# Patient Record
Sex: Female | Born: 1945 | Race: Black or African American | Hispanic: No | Smoking: Former smoker
Health system: Southern US, Community
[De-identification: ages and names within clinical notes are randomized; demographics above are authoritative.]

## PROBLEM LIST (undated history)

## (undated) DIAGNOSIS — H409 Unspecified glaucoma: Secondary | ICD-10-CM

## (undated) DIAGNOSIS — E7800 Pure hypercholesterolemia, unspecified: Secondary | ICD-10-CM

## (undated) DIAGNOSIS — I10 Essential (primary) hypertension: Secondary | ICD-10-CM

## (undated) HISTORY — PX: CATARACT EXTRACTION: SUR2

## (undated) HISTORY — PX: DENTAL SURGERY: SHX609

## (undated) HISTORY — PX: EYE SURGERY: SHX253

## (undated) HISTORY — PX: CERVICAL DISCECTOMY: SHX98

## (undated) HISTORY — PX: ECTOPIC PREGNANCY SURGERY: SHX613

## (undated) HISTORY — PX: TONSILLECTOMY: SUR1361

---

## 1972-01-30 HISTORY — PX: ECTOPIC PREGNANCY SURGERY: SHX613

## 1997-08-05 ENCOUNTER — Ambulatory Visit (HOSPITAL_COMMUNITY): Admission: RE | Admit: 1997-08-05 | Discharge: 1997-08-05 | Payer: Self-pay | Admitting: Internal Medicine

## 1997-09-10 ENCOUNTER — Ambulatory Visit (HOSPITAL_COMMUNITY): Admission: RE | Admit: 1997-09-10 | Discharge: 1997-09-10 | Payer: Self-pay | Admitting: Internal Medicine

## 1997-09-17 ENCOUNTER — Ambulatory Visit (HOSPITAL_COMMUNITY): Admission: RE | Admit: 1997-09-17 | Discharge: 1997-09-17 | Payer: Self-pay | Admitting: Gastroenterology

## 1997-11-25 ENCOUNTER — Encounter: Payer: Self-pay | Admitting: General Surgery

## 1997-11-29 ENCOUNTER — Ambulatory Visit (HOSPITAL_COMMUNITY): Admission: RE | Admit: 1997-11-29 | Discharge: 1997-11-30 | Payer: Self-pay | Admitting: General Surgery

## 1997-12-03 ENCOUNTER — Encounter: Admission: RE | Admit: 1997-12-03 | Discharge: 1998-03-03 | Payer: Self-pay | Admitting: Orthopedic Surgery

## 1998-08-12 ENCOUNTER — Ambulatory Visit (HOSPITAL_COMMUNITY): Admission: RE | Admit: 1998-08-12 | Discharge: 1998-08-12 | Payer: Self-pay | Admitting: Internal Medicine

## 1998-08-12 ENCOUNTER — Encounter: Payer: Self-pay | Admitting: Internal Medicine

## 1999-08-14 ENCOUNTER — Encounter: Payer: Self-pay | Admitting: Internal Medicine

## 1999-08-14 ENCOUNTER — Ambulatory Visit (HOSPITAL_COMMUNITY): Admission: RE | Admit: 1999-08-14 | Discharge: 1999-08-14 | Payer: Self-pay | Admitting: Internal Medicine

## 1999-10-24 ENCOUNTER — Other Ambulatory Visit: Admission: RE | Admit: 1999-10-24 | Discharge: 1999-10-24 | Payer: Self-pay | Admitting: Internal Medicine

## 2000-08-14 ENCOUNTER — Encounter: Payer: Self-pay | Admitting: Internal Medicine

## 2000-08-14 ENCOUNTER — Ambulatory Visit (HOSPITAL_COMMUNITY): Admission: RE | Admit: 2000-08-14 | Discharge: 2000-08-14 | Payer: Self-pay | Admitting: Internal Medicine

## 2000-10-24 ENCOUNTER — Other Ambulatory Visit: Admission: RE | Admit: 2000-10-24 | Discharge: 2000-10-24 | Payer: Self-pay | Admitting: Internal Medicine

## 2001-03-18 ENCOUNTER — Encounter: Admission: RE | Admit: 2001-03-18 | Discharge: 2001-05-05 | Payer: Self-pay | Admitting: Orthopedic Surgery

## 2001-06-03 ENCOUNTER — Encounter: Admission: RE | Admit: 2001-06-03 | Discharge: 2001-06-03 | Payer: Self-pay | Admitting: Internal Medicine

## 2001-06-03 ENCOUNTER — Encounter: Payer: Self-pay | Admitting: Internal Medicine

## 2001-09-12 ENCOUNTER — Encounter: Admission: RE | Admit: 2001-09-12 | Discharge: 2001-09-12 | Payer: Self-pay | Admitting: Internal Medicine

## 2001-09-12 ENCOUNTER — Encounter: Payer: Self-pay | Admitting: Internal Medicine

## 2002-01-15 ENCOUNTER — Encounter: Payer: Self-pay | Admitting: Neurosurgery

## 2002-01-16 ENCOUNTER — Encounter: Payer: Self-pay | Admitting: Neurosurgery

## 2002-01-16 ENCOUNTER — Observation Stay (HOSPITAL_COMMUNITY): Admission: RE | Admit: 2002-01-16 | Discharge: 2002-01-17 | Payer: Self-pay | Admitting: Neurosurgery

## 2002-01-30 ENCOUNTER — Encounter: Payer: Self-pay | Admitting: Emergency Medicine

## 2002-01-30 ENCOUNTER — Emergency Department (HOSPITAL_COMMUNITY): Admission: EM | Admit: 2002-01-30 | Discharge: 2002-01-30 | Payer: Self-pay | Admitting: Emergency Medicine

## 2002-03-17 ENCOUNTER — Ambulatory Visit (HOSPITAL_COMMUNITY): Admission: RE | Admit: 2002-03-17 | Discharge: 2002-03-17 | Payer: Self-pay | Admitting: Gastroenterology

## 2002-11-04 ENCOUNTER — Encounter: Payer: Self-pay | Admitting: Internal Medicine

## 2002-11-04 ENCOUNTER — Encounter: Admission: RE | Admit: 2002-11-04 | Discharge: 2002-11-04 | Payer: Self-pay | Admitting: Internal Medicine

## 2003-09-08 ENCOUNTER — Emergency Department (HOSPITAL_COMMUNITY): Admission: EM | Admit: 2003-09-08 | Discharge: 2003-09-08 | Payer: Self-pay | Admitting: Emergency Medicine

## 2003-10-28 ENCOUNTER — Encounter: Admission: RE | Admit: 2003-10-28 | Discharge: 2003-10-28 | Payer: Self-pay | Admitting: Internal Medicine

## 2003-12-31 ENCOUNTER — Other Ambulatory Visit: Admission: RE | Admit: 2003-12-31 | Discharge: 2003-12-31 | Payer: Self-pay | Admitting: Internal Medicine

## 2004-03-23 ENCOUNTER — Emergency Department (HOSPITAL_COMMUNITY): Admission: EM | Admit: 2004-03-23 | Discharge: 2004-03-23 | Payer: Self-pay | Admitting: Emergency Medicine

## 2004-12-28 ENCOUNTER — Encounter: Admission: RE | Admit: 2004-12-28 | Discharge: 2004-12-28 | Payer: Self-pay | Admitting: Internal Medicine

## 2005-01-22 ENCOUNTER — Emergency Department (HOSPITAL_COMMUNITY): Admission: EM | Admit: 2005-01-22 | Discharge: 2005-01-23 | Payer: Self-pay | Admitting: Emergency Medicine

## 2006-08-07 ENCOUNTER — Encounter: Admission: RE | Admit: 2006-08-07 | Discharge: 2006-08-07 | Payer: Self-pay | Admitting: Family Medicine

## 2006-10-23 ENCOUNTER — Other Ambulatory Visit: Admission: RE | Admit: 2006-10-23 | Discharge: 2006-10-23 | Payer: Self-pay | Admitting: Family Medicine

## 2007-06-05 ENCOUNTER — Encounter: Admission: RE | Admit: 2007-06-05 | Discharge: 2007-06-05 | Payer: Self-pay | Admitting: Family Medicine

## 2007-08-08 ENCOUNTER — Encounter: Admission: RE | Admit: 2007-08-08 | Discharge: 2007-08-08 | Payer: Self-pay | Admitting: Family Medicine

## 2007-08-31 ENCOUNTER — Emergency Department (HOSPITAL_COMMUNITY): Admission: EM | Admit: 2007-08-31 | Discharge: 2007-08-31 | Payer: Self-pay | Admitting: Family Medicine

## 2008-04-19 ENCOUNTER — Other Ambulatory Visit: Admission: RE | Admit: 2008-04-19 | Discharge: 2008-04-19 | Payer: Self-pay | Admitting: Family Medicine

## 2008-08-09 ENCOUNTER — Encounter: Admission: RE | Admit: 2008-08-09 | Discharge: 2008-08-09 | Payer: Self-pay | Admitting: Family Medicine

## 2008-12-14 ENCOUNTER — Encounter: Admission: RE | Admit: 2008-12-14 | Discharge: 2008-12-14 | Payer: Self-pay | Admitting: Sports Medicine

## 2009-05-27 ENCOUNTER — Emergency Department (HOSPITAL_COMMUNITY): Admission: EM | Admit: 2009-05-27 | Discharge: 2009-05-27 | Payer: Self-pay | Admitting: Emergency Medicine

## 2009-08-12 ENCOUNTER — Encounter: Admission: RE | Admit: 2009-08-12 | Discharge: 2009-08-12 | Payer: Self-pay | Admitting: Family Medicine

## 2010-02-19 ENCOUNTER — Encounter: Payer: Self-pay | Admitting: Sports Medicine

## 2010-06-16 NOTE — Op Note (Signed)
Kimberly Garza, Kimberly Garza                          ACCOUNT NO.:  [id number]   MEDICAL RECORD NO.:  [id number]                   PATIENT TYPE:  INP   LOCATION:  3172                                 FACILITY:  [facility]   PHYSICIAN:  Coletta Memos, M.D.                  DATE OF BIRTH:  1945/08/21   DATE OF PROCEDURE:  01/16/2002  DATE OF DISCHARGE:                                 OPERATIVE REPORT   PREOPERATIVE DIAGNOSES:  1. Displaced disk at C5-6, right.  2. Cervical radiculopathy.  3. Cervical spondylosis without myelopathy.   POSTOPERATIVE DIAGNOSES:  1. Displaced disk at C5-6, right.  2. Cervical radiculopathy.  3. Cervical spondylosis without myelopathy.   PROCEDURE:  Anterior cervical decompression and arthrodesis, C5-6, with  structural allograft and anterior instrumentation.   COMPLICATIONS:  None.   SURGEON:  Coletta Memos, M.D.   ASSISTANT:  Hilda Lias, M.D.   ANESTHESIA:  General endotracheal.   INDICATIONS:  The patient is a 65 year old woman who presents with pain in  the right upper extremity.  She on MRI has a herniated disk at C5-6 on the  right side.  She also has spondylosis.  After conservative methods have  failed, I have recommended and she agreed to undergo an anterior cervical  decompression and arthrodesis.   DESCRIPTION OF PROCEDURE:  The patient was brought to the operating room,  intubated, and placed under general anesthesia without difficulty.  She was  placed in slight extension at the neck with 10 pounds of traction applied  via chin strap.  The neck was prepped and she was draped in a sterile  fashion.  I infiltrated 3 cc 0.5% lidocaine and 1:200,000 strength  epinephrine into my proposed incision, starting at the midline, extending to  the medial border of the left sternocleidomastoid.  I made my incision with  a #10 blade and took this down to the platysma.  I dissected superior to the  platysma rostrally and caudally.  I then opened the  platysma in a horizontal  fashion using Metzenbaum scissors.  I controlled bleeding in the  subcutaneous tissues with bipolar cautery.  After dividing the platysma, I  then created an avascular path between the sternocleidomastoid and the  medial strap muscles to the anterior cervical spine.  I placed a spinal  needle at the disk space I believed to be C5-6.  X-ray confirmed the  location of the needle at C5-6.  I then reflected the longus colli muscles  bilaterally.  I placed a self-retaining Caspar retractor.  I opened the disk  space with a #15 blade and removed a small amount of disk material.  I then  placed two distraction pins, one in C5, the other in C6.  I then brought the  microscope in to aid in dissection and using curettes, pituitary rongeurs,  and a high-speed drill, removed both osteophytes and disk material until  I  had obtained a good decompression of both C6 nerve roots.  Dr. Jeral Fruit  assisted with the decompression of the nerve roots and spinal canal.  After  being satisfied with my decompression, I then placed an 8 mm Tutogen bone  graft into the empty disk space.  I had the weights removed from the neck.  I also removed the distraction pins.  I then placed an anterior plate, small-  stature Synthes, two screws at C5, two screws at C6, and locking screws were  also placed.  These screws placed first by drilling, then subsequently  tapping the hole.  X-rays showed the plate and bone plug along with the  screws to be in good position.  I irrigated again.  Dr. Jeral Fruit and I then  closed the wound in layered fashion, reapproximating the platysma and then  subcutaneous tissues.  The patient was extubated, moving all extremities  postoperatively.  Dermabond was used for a sterile dressing.                                               Coletta Memos, M.D.    KC/MEDQ  D:  01/16/2002  T:  01/17/2002  Job:  [id number]

## 2010-08-22 ENCOUNTER — Other Ambulatory Visit: Payer: Self-pay | Admitting: Family Medicine

## 2010-08-22 DIAGNOSIS — Z1231 Encounter for screening mammogram for malignant neoplasm of breast: Secondary | ICD-10-CM

## 2010-08-28 ENCOUNTER — Ambulatory Visit: Payer: Self-pay

## 2010-08-28 ENCOUNTER — Ambulatory Visit
Admission: RE | Admit: 2010-08-28 | Discharge: 2010-08-28 | Disposition: A | Discharge status: Home / Self Care | Payer: BC Managed Care – PPO | Source: Ambulatory Visit | Attending: Family Medicine | Admitting: Family Medicine

## 2010-08-28 DIAGNOSIS — Z1231 Encounter for screening mammogram for malignant neoplasm of breast: Secondary | ICD-10-CM

## 2010-09-01 ENCOUNTER — Other Ambulatory Visit: Payer: Self-pay | Admitting: Allergy and Immunology

## 2010-09-01 ENCOUNTER — Ambulatory Visit
Admission: RE | Admit: 2010-09-01 | Discharge: 2010-09-01 | Disposition: A | Discharge status: Home / Self Care | Payer: Medicare Other | Source: Ambulatory Visit | Attending: Allergy and Immunology | Admitting: Allergy and Immunology

## 2010-09-01 DIAGNOSIS — R05 Cough: Secondary | ICD-10-CM

## 2010-10-23 ENCOUNTER — Ambulatory Visit: Payer: Medicare Other | Admitting: Internal Medicine

## 2011-06-18 ENCOUNTER — Encounter (HOSPITAL_COMMUNITY): Payer: Self-pay | Admitting: *Deleted

## 2011-06-18 ENCOUNTER — Emergency Department (HOSPITAL_COMMUNITY)
Admission: EM | Admit: 2011-06-18 | Discharge: 2011-06-18 | Disposition: A | Discharge status: Home / Self Care | Payer: Medicare Other | Attending: Emergency Medicine | Admitting: Emergency Medicine

## 2011-06-18 ENCOUNTER — Emergency Department (INDEPENDENT_AMBULATORY_CARE_PROVIDER_SITE_OTHER)
Admission: EM | Admit: 2011-06-18 | Discharge: 2011-06-18 | Disposition: A | Discharge status: Home / Self Care | Payer: Medicare Other | Source: Home / Self Care | Attending: Emergency Medicine | Admitting: Emergency Medicine

## 2011-06-18 ENCOUNTER — Encounter (HOSPITAL_COMMUNITY): Payer: Self-pay

## 2011-06-18 DIAGNOSIS — R197 Diarrhea, unspecified: Secondary | ICD-10-CM | POA: Insufficient documentation

## 2011-06-18 DIAGNOSIS — R109 Unspecified abdominal pain: Secondary | ICD-10-CM

## 2011-06-18 DIAGNOSIS — Z79899 Other long term (current) drug therapy: Secondary | ICD-10-CM | POA: Insufficient documentation

## 2011-06-18 DIAGNOSIS — J45909 Unspecified asthma, uncomplicated: Secondary | ICD-10-CM | POA: Insufficient documentation

## 2011-06-18 HISTORY — DX: Unspecified glaucoma: H40.9

## 2011-06-18 LAB — POCT I-STAT, CHEM 8
Calcium, Ion: 1.26 mmol/L (ref 1.12–1.32)
Chloride: 109 mEq/L (ref 96–112)
Creatinine, Ser: 0.8 mg/dL (ref 0.50–1.10)
Glucose, Bld: 92 mg/dL (ref 70–99)
HCT: 42 % (ref 36.0–46.0)
Hemoglobin: 14.3 g/dL (ref 12.0–15.0)
Potassium: 4.5 mEq/L (ref 3.5–5.1)
TCO2: 26 mmol/L (ref 0–100)

## 2011-06-18 LAB — CBC
HCT: 38.6 % (ref 36.0–46.0)
MCHC: 33.7 g/dL (ref 30.0–36.0)
MCV: 83.4 fL (ref 78.0–100.0)
Platelets: 325 10*3/uL (ref 150–400)
RBC: 4.63 MIL/uL (ref 3.87–5.11)
RDW: 13.7 % (ref 11.5–15.5)
WBC: 6.8 10*3/uL (ref 4.0–10.5)

## 2011-06-18 LAB — DIFFERENTIAL
Basophils Absolute: 0.1 10*3/uL (ref 0.0–0.1)
Basophils Relative: 2 % — ABNORMAL HIGH (ref 0–1)
Eosinophils Absolute: 0.1 10*3/uL (ref 0.0–0.7)
Eosinophils Relative: 2 % (ref 0–5)
Lymphocytes Relative: 38 % (ref 12–46)
Lymphs Abs: 2.6 10*3/uL (ref 0.7–4.0)
Monocytes Absolute: 0.4 10*3/uL (ref 0.1–1.0)
Monocytes Relative: 6 % (ref 3–12)
Neutrophils Relative %: 52 % (ref 43–77)

## 2011-06-18 LAB — OCCULT BLOOD, POC DEVICE: Fecal Occult Bld: NEGATIVE

## 2011-06-18 LAB — POCT URINALYSIS DIP (DEVICE)
Ketones, ur: NEGATIVE mg/dL
Protein, ur: NEGATIVE mg/dL
Urobilinogen, UA: 0.2 mg/dL (ref 0.0–1.0)

## 2011-06-18 LAB — BASIC METABOLIC PANEL
Calcium: 9.9 mg/dL (ref 8.4–10.5)
Creatinine, Ser: 0.72 mg/dL (ref 0.50–1.10)
GFR calc Af Amer: >90 mL/min (ref >90)
GFR calc non Af Amer: 86 mL/min — ABNORMAL LOW (ref >90)

## 2011-06-18 LAB — LIPASE, BLOOD: Lipase: 26 U/L (ref 11–59)

## 2011-06-18 MED ORDER — CIPROFLOXACIN HCL 500 MG PO TABS: 500.0000 mg | ORAL_TABLET | Freq: Two times a day (BID) | ORAL | Status: AC

## 2011-06-18 MED ORDER — METRONIDAZOLE 500 MG PO TABS: 500.0000 mg | ORAL_TABLET | Freq: Three times a day (TID) | ORAL | Status: AC

## 2011-06-18 MED ORDER — SODIUM CHLORIDE 0.9 % IV BOLUS (SEPSIS)
1000.0000 mL | INTRAVENOUS | Status: AC
  Administered 2011-06-18: 1000 mL via INTRAVENOUS

## 2011-06-18 NOTE — ED Notes (Signed)
UA order clicked off by accident; pt has not provided a urine sample at this time. Kimberly Garza

## 2011-06-18 NOTE — ED Notes (Signed)
Patient states traveled outside of the country over a week ago and developed diarrhea multiple episodes continued when patient arrived home.  States at times when patient has urge to have a bowel movement it is almost immediate she has to go.  This week multiple diarrhea episodes loose stool.  General abdomen tenderness seen at Urgent Care today who sent patient to ED for further evaluation.  Patient is Ax4 calm cooperative.

## 2011-06-18 NOTE — ED Notes (Signed)
Went to the [place] on Mother's day weekend, and although ate mostly at hotel , has had nearly non-stop GI symptoms. Has tried peptobismol, acid blocker, antacids, and imodium for her symptoms that started 5-12. Has had 2 BM's today

## 2011-06-18 NOTE — ED Provider Notes (Signed)
History     CSN: [id number]  Arrival date & time 06/18/11  1205   First MD Initiated Contact with Patient 06/18/11 1506      Chief Complaint  Patient presents with  . Diarrhea    (Consider location/radiation/quality/duration/timing/severity/associated sxs/prior treatment) Patient is a 66 y.o. female presenting with diarrhea. The history is provided by the patient.  Diarrhea The primary symptoms include diarrhea. Primary symptoms do not include fever, fatigue, abdominal pain, nausea, vomiting or dysuria. The illness began more than 7 days ago. The onset was sudden. The problem has not changed since onset. The diarrhea began more than 1 week ago. The diarrhea is watery. The diarrhea occurs 2 to 4 times per day. Risk factors for illness producing diarrhea include suspect food intake and travel to endemic areas.  The illness does not include back pain. Associated symptoms comments: Abdominal cramping, nausea. Risk factors for a gastrointestinal illness include travel to endemic areas.    Past Medical History  Diagnosis Date  . Glaucoma   . Asthma     Past Surgical History  Procedure Date  . Cervical discectomy     History reviewed. No pertinent family history.  History  Substance Use Topics  . Smoking status: Never Smoker   . Smokeless tobacco: Not on file  . Alcohol Use: No    OB History    Grav Para Term Preterm Abortions TAB SAB Ect Mult Living                  Review of Systems  Constitutional: Negative for fever and fatigue.  HENT: Negative for congestion, drooling and neck pain.   Eyes: Negative for pain.  Respiratory: Negative for cough and shortness of breath.   Cardiovascular: Negative for chest pain.  Gastrointestinal: Positive for diarrhea. Negative for nausea, vomiting and abdominal pain.  Genitourinary: Negative for dysuria and hematuria.  Musculoskeletal: Negative for back pain and gait problem.  Skin: Negative for color change.  Neurological:  Negative for dizziness and headaches.  Hematological: Negative for adenopathy.  Psychiatric/Behavioral: Negative for behavioral problems.  All other systems reviewed and are negative.    Allergies  Penicillins  Home Medications   Current Outpatient Rx  Name Route Sig Dispense Refill  . BIMATOPROST 0.01 % OP SOLN Both Eyes Place 1 drop into both eyes at bedtime.    Marland Kitchen BRINZOLAMIDE 1 % OP SUSP Both Eyes Place 1 drop into both eyes 3 (three) times daily.     Marland Kitchen VITAMIN D3 1000 UNITS PO CHEW Oral Chew 2 tablets by mouth every morning.    . CYCLOSPORINE 0.05 % OP EMUL Both Eyes Place 1 drop into both eyes 2 (two) times daily.     Marland Kitchen ROSUVASTATIN CALCIUM 10 MG PO TABS Oral Take 10 mg by mouth every evening.     Marland Kitchen CIPROFLOXACIN HCL 500 MG PO TABS Oral Take 1 tablet (500 mg total) by mouth every 12 (twelve) hours. 14 tablet 0  . METRONIDAZOLE 500 MG PO TABS Oral Take 1 tablet (500 mg total) by mouth 3 (three) times daily. 21 tablet 0    BP 151/81  Pulse 62  Temp 98.1 F (36.7 C)  Resp 18  SpO2 99%  Physical Exam  Constitutional: She is oriented to person, place, and time. She appears well-developed and well-nourished.  HENT:  Head: Normocephalic.  Mouth/Throat: No oropharyngeal exudate.  Eyes: Conjunctivae and EOM are normal. Pupils are equal, round, and reactive to light.  Neck: Normal range of motion.  Neck supple.  Cardiovascular: Normal rate, regular rhythm, normal heart sounds and intact distal pulses.  Exam reveals no gallop and no friction rub.   No murmur heard. Pulmonary/Chest: Effort normal and breath sounds normal. No respiratory distress. She has no wheezes.  Abdominal: Soft. Bowel sounds are normal. She exhibits no mass. There is tenderness (mild ttp of lower abdomen, worse in LLQ). There is no rebound and no guarding.  Musculoskeletal: Normal range of motion. She exhibits no edema and no tenderness.  Neurological: She is alert and oriented to person, place, and time.    Skin: Skin is warm and dry.  Psychiatric: She has a normal mood and affect. Her behavior is normal.    ED Course  Procedures (including critical care time)  Labs Reviewed  BASIC METABOLIC PANEL - Abnormal; Notable for the following:    GFR calc non Af Amer 86 (*)    All other components within normal limits  OCCULT BLOOD, POC DEVICE   No results found.   1. Diarrhea       MDM  12:49 AM 66 y.o. female pw diarrhea and abdominal cramping since May 12-13th when she was in the [place] on vacation at a resort. She had emesis initially, denies fever or blood in stool. Pt notes that sx not worse, but persistent. Pt AFVSS here, appears well on exam, mild to mod ttp of lower abdomen. Suspect travelers diarrhea. Will get stool studies. Pt hemeoccult neg. Will start on cipro/flagyl.    12:49 AM: Pt continues to appear well, unable to provide a stool sample. Will send w/ cup for cx. I have discussed the diagnosis/risks/treatment options with the patient and believe the pt to be eligible for discharge home to follow-up with pcp for stool cx results. We also discussed returning to the ED immediately if new or worsening sx occur. We discussed the sx which are most concerning (e.g., worsening pain, fever, bloody stools) that necessitate immediate return. Any new prescriptions provided to the patient are listed below.  New Prescriptions   CIPROFLOXACIN (CIPRO) 500 MG TABLET    Take 1 tablet (500 mg total) by mouth every 12 (twelve) hours.   METRONIDAZOLE (FLAGYL) 500 MG TABLET    Take 1 tablet (500 mg total) by mouth 3 (three) times daily.   Clinical Impression 1. Diarrhea         Purvis Sheffield, MD 06/19/11 [phone]

## 2011-06-18 NOTE — ED Notes (Signed)
To ED for further eval of continued diarrhea since visiting [place] last weekend. States since eating on her first day there she has had continued diarrhea multiple times per day.

## 2011-06-18 NOTE — ED Provider Notes (Addendum)
History     CSN: [id number]  Arrival date & time 06/18/11  0865   First MD Initiated Contact with Patient 06/18/11 1017      Chief Complaint  Patient presents with  . Abdominal Pain    (Consider location/radiation/quality/duration/timing/severity/associated sxs/prior treatment) HPI Comments: Patient presents urgent care this morning with ongoing liquid diarrheas and abdominal pain. She describes that the night before returning to the [place] from [place] she started abdominal cramps and diarrheas multiple episodes within the first couple days. Have subsided some and number and now has 3-4 episodes a day. She describes in her abdomen points towards her mid and lower abdomen (periumbilical area and left lower quadrant). Denies fevers, and denies any respiratory symptoms.  Patient describes it pain is somewhat worse and she's not feeling any better after so many days. She has been taking multiple over-the-counter medicines including Pepto-Bismol antiacids and Imodium her bowel movements. Patient denies having taken any antibiotics prior to her trip or during her trip.   Patient is a 66 y.o. female presenting with abdominal pain. The history is provided by the patient.  Abdominal Pain The primary symptoms of the illness include abdominal pain, nausea, vomiting and diarrhea. The primary symptoms of the illness do not include fever, fatigue, hematemesis, dysuria or vaginal bleeding. Episode onset: 8 days. The onset of the illness was gradual. The problem has not changed since onset. The patient states that she believes she is currently not pregnant. The patient has had a change in bowel habit. Additional symptoms associated with the illness include anorexia. Symptoms associated with the illness do not include constipation, frequency or back pain.    Past Medical History  Diagnosis Date  . Glaucoma   . Asthma     Past Surgical History  Procedure Date  . Cervical discectomy      History reviewed. No pertinent family history.  History  Substance Use Topics  . Smoking status: Never Smoker   . Smokeless tobacco: Not on file  . Alcohol Use: No    OB History    Grav Para Term Preterm Abortions TAB SAB Ect Mult Living                  Review of Systems  Constitutional: Positive for appetite change. Negative for fever, activity change and fatigue.  Gastrointestinal: Positive for nausea, vomiting, abdominal pain, diarrhea and anorexia. Negative for constipation and hematemesis.  Genitourinary: Negative for dysuria, frequency and vaginal bleeding.  Musculoskeletal: Negative for back pain.    Allergies  Penicillins  Home Medications   Current Outpatient Rx  Name Route Sig Dispense Refill  . BRINZOLAMIDE 1 % OP SUSP  1 drop 3 (three) times daily.    . CYCLOSPORINE 0.05 % OP EMUL  1 drop 2 (two) times daily.    Marland Kitchen ROSUVASTATIN CALCIUM 10 MG PO TABS Oral Take 10 mg by mouth daily.      BP 152/90  Pulse 67  Temp(Src) 98.1 F (36.7 C) (Oral)  Resp 16  SpO2 100%  Physical Exam  Nursing note and vitals reviewed. Constitutional: She appears well-developed. No distress.  HENT:  Head: Normocephalic.  Mouth/Throat: No oropharyngeal exudate.  Eyes: No scleral icterus.  Neck: Neck supple.  Abdominal: Soft. She exhibits no shifting dullness, no distension and no mass. There is no hepatosplenomegaly or hepatomegaly. There is tenderness in the epigastric area, periumbilical area and left lower quadrant. There is no rigidity, no rebound, no guarding and negative Murphy's sign.  Skin: Skin is warm. No erythema.    ED Course  Procedures (including critical care time)   Labs Reviewed  CBC  DIFFERENTIAL  LIPASE, BLOOD  POCT I-STAT, CHEM 8  STOOL CULTURE   No results found.   1. Abdominal pain       MDM   Although patient can be experiencing a infectious gastroenteritis her abdominal exam some what concerned that we could including her  differential diverticulitis of an intra-abdominal condition. Patient was unable to provide [place] with a stool sample is a differential diagnosis would include also Giardia lamblia you and other most common bacterial infection such as Salmonella or Escherichia coli. CBC with differential is pending patient will need some IV hydration support and perhaps more evaluation with imaging       Jimmie Molly, MD 06/18/11 1149  Jimmie Molly, MD 06/18/11 1150

## 2011-06-19 NOTE — ED Provider Notes (Signed)
  I performed a history and physical examination of Kimberly Garza and discussed her management with Dr. Romeo Apple.  I agree with the history, physical, assessment, and plan of care, with the following exceptions: None  Diarrhea has been present for proximally 7 days. This began after returning from the [place]. No blood in the stool. Denies fever, chills. She has some associated abdominal cramping. No urinary symptoms. Abd exam is benign. Will sent home with a stool culture. Laboratory studies unremarkable. She'll be discharged home with Cipro and flagyl  Tildon Husky, MD 06/19/11 [phone]

## 2011-09-14 ENCOUNTER — Other Ambulatory Visit: Payer: Self-pay | Admitting: Family Medicine

## 2011-09-14 DIAGNOSIS — Z1231 Encounter for screening mammogram for malignant neoplasm of breast: Secondary | ICD-10-CM

## 2011-09-28 ENCOUNTER — Emergency Department (HOSPITAL_COMMUNITY)
Admission: EM | Admit: 2011-09-28 | Discharge: 2011-09-28 | Disposition: A | Discharge status: Home / Self Care | Payer: Medicare Other | Attending: Emergency Medicine | Admitting: Emergency Medicine

## 2011-09-28 ENCOUNTER — Emergency Department (HOSPITAL_COMMUNITY): Payer: Medicare Other

## 2011-09-28 ENCOUNTER — Encounter (HOSPITAL_COMMUNITY): Payer: Self-pay | Admitting: Family Medicine

## 2011-09-28 DIAGNOSIS — J45909 Unspecified asthma, uncomplicated: Secondary | ICD-10-CM | POA: Insufficient documentation

## 2011-09-28 DIAGNOSIS — E7800 Pure hypercholesterolemia, unspecified: Secondary | ICD-10-CM | POA: Insufficient documentation

## 2011-09-28 DIAGNOSIS — M5430 Sciatica, unspecified side: Secondary | ICD-10-CM | POA: Insufficient documentation

## 2011-09-28 HISTORY — DX: Pure hypercholesterolemia, unspecified: E78.00

## 2011-09-28 MED ORDER — KETOROLAC TROMETHAMINE 60 MG/2ML IM SOLN
60.0000 mg | Freq: Once | INTRAMUSCULAR | Status: AC
  Administered 2011-09-28: 60 mg via INTRAMUSCULAR
  Filled 2011-09-28: qty 2

## 2011-09-28 MED ORDER — OXYCODONE-ACETAMINOPHEN 5-325 MG PO TABS: ORAL_TABLET | ORAL | Status: AC

## 2011-09-28 NOTE — ED Provider Notes (Signed)
Medical screening examination/treatment/procedure(s) were performed by non-physician practitioner and as supervising physician I was immediately available for consultation/collaboration.   Derwood Kaplan, MD 09/28/11 [phone]

## 2011-09-28 NOTE — ED Provider Notes (Signed)
History     CSN: [id number]  Arrival date & time 09/28/11  0858   First MD Initiated Contact with Patient 09/28/11 [phone]      Chief Complaint  Patient presents with  . Leg Pain  . Hip Pain    (Consider location/radiation/quality/duration/timing/severity/associated sxs/prior treatment) HPI  Kimberly Garza is a 66 y.o. female in no acute distress complaining of right hip and leg  pain x2days 10 out of 10, radiating to the ankle, with pins and needles paresthesia, exacerbated by weightbearing. Moderately relieved with Percocet. . Patient denies any trauma but was renovating her house and put her right leg on a saw horse while cutting a board with a jigsaw. Denies numbness and paresthesia, ambulates with cane to minimize pain, she does not need this at baseline.  Past Medical History  Diagnosis Date  . Glaucoma   . Asthma   . High cholesterol     Past Surgical History  Procedure Date  . Cervical discectomy     History reviewed. No pertinent family history.  History  Substance Use Topics  . Smoking status: Never Smoker   . Smokeless tobacco: Not on file  . Alcohol Use: No    OB History    Grav Para Term Preterm Abortions TAB SAB Ect Mult Living                  Review of Systems  Musculoskeletal: Positive for arthralgias.  Neurological: Negative for weakness and numbness.  All other systems reviewed and are negative.    Allergies  Penicillins  Home Medications   Current Outpatient Rx  Name Route Sig Dispense Refill  . ALBUTEROL SULFATE HFA 108 (90 BASE) MCG/ACT IN AERS Inhalation Inhale 2 puffs into the lungs every 6 (six) hours as needed. For wheezing    . BIMATOPROST 0.01 % OP SOLN Both Eyes Place 1 drop into both eyes at bedtime.    Marland Kitchen BRINZOLAMIDE 1 % OP SUSP Both Eyes Place 1 drop into both eyes 3 (three) times daily.     Marland Kitchen VITAMIN D3 1000 UNITS PO CHEW Oral Chew 2 tablets by mouth every morning.    . CYCLOSPORINE 0.05 % OP EMUL Both Eyes Place 1 drop  into both eyes 2 (two) times daily.     Marland Kitchen ROSUVASTATIN CALCIUM 10 MG PO TABS Oral Take 10 mg by mouth every evening.       BP 147/75  Pulse 71  Temp 97.9 F (36.6 C) (Oral)  Resp 18  SpO2 99%  Physical Exam  Nursing note and vitals reviewed. Constitutional: She is oriented to person, place, and time. She appears well-developed and well-nourished. No distress.  HENT:  Head: Normocephalic.  Eyes: Conjunctivae and EOM are normal.  Neck: Normal range of motion.  Cardiovascular: Normal rate.   Pulmonary/Chest: Effort normal.  Abdominal: Soft.  Musculoskeletal: Normal range of motion.       Right hip has full active range of motion with no tenderness to palpation, swelling. Distal sensation intact to light touch and pinprick. Straight leg raise positive on the ipsilateral side at 15 negative on contralateral side. Patient ambulates with an antalgic gait. Distal sensation intact to pinprick and light touch.   Neurological: She is alert and oriented to person, place, and time.  Skin: Skin is warm. No rash noted. No erythema. No pallor.  Psychiatric: She has a normal mood and affect.    ED Course  Procedures (including critical care time)  Labs Reviewed - No  data to display Dg Hip Complete Right  09/28/2011  *RADIOLOGY REPORT*  Clinical Data: Right hip injury and pain.  RIGHT HIP - COMPLETE 2+ VIEW  Comparison: None.  Findings: No evidence of fracture or dislocation.  No evidence of hip joint arthropathy or other significant bone abnormality. Incidental note is made of a calcified fibroid in the left pelvis.  IMPRESSION:  1.  No acute findings. 2.  Calcified uterine fibroid in the left pelvis.   Original Report Authenticated By: Danae Orleans, M.D.      1. Sciatica       MDM  History of present illness and physical exam with positive straight leg raise consistent with sciatica. I will give the patient 60 mg of Toradol IM. She is driving home use of narcotics is not indicated at  this time. I will order a x-ray just to rule out any sort of fracture or joint space narrowing.   Hip x-ray is normal, I will DC with Percocet and Toradol by mouth with orthopedic followup.       Wynetta Emery, PA-C 09/28/11 1047

## 2011-09-28 NOTE — ED Notes (Signed)
Pt complaining of right hip pain radiating down to her left foot. sts moved her leg the wrong way the other day and felt pain. Painful to bear weight.

## 2011-10-18 ENCOUNTER — Ambulatory Visit
Admission: RE | Admit: 2011-10-18 | Discharge: 2011-10-18 | Disposition: A | Discharge status: Home / Self Care | Payer: Medicare Other | Source: Ambulatory Visit | Attending: Family Medicine | Admitting: Family Medicine

## 2011-10-18 DIAGNOSIS — Z1231 Encounter for screening mammogram for malignant neoplasm of breast: Secondary | ICD-10-CM

## 2011-10-19 ENCOUNTER — Ambulatory Visit: Payer: Medicare Other

## 2012-01-17 ENCOUNTER — Emergency Department (HOSPITAL_COMMUNITY)
Admission: EM | Admit: 2012-01-17 | Discharge: 2012-01-17 | Disposition: A | Discharge status: Home / Self Care | Payer: No Typology Code available for payment source | Attending: Emergency Medicine | Admitting: Emergency Medicine

## 2012-01-17 ENCOUNTER — Encounter (HOSPITAL_COMMUNITY): Payer: Self-pay | Admitting: Family Medicine

## 2012-01-17 ENCOUNTER — Emergency Department (HOSPITAL_COMMUNITY): Payer: No Typology Code available for payment source

## 2012-01-17 DIAGNOSIS — Y92410 Unspecified street and highway as the place of occurrence of the external cause: Secondary | ICD-10-CM | POA: Insufficient documentation

## 2012-01-17 DIAGNOSIS — S139XXA Sprain of joints and ligaments of unspecified parts of neck, initial encounter: Secondary | ICD-10-CM | POA: Insufficient documentation

## 2012-01-17 DIAGNOSIS — Z79899 Other long term (current) drug therapy: Secondary | ICD-10-CM | POA: Insufficient documentation

## 2012-01-17 DIAGNOSIS — R51 Headache: Secondary | ICD-10-CM | POA: Insufficient documentation

## 2012-01-17 DIAGNOSIS — Y9389 Activity, other specified: Secondary | ICD-10-CM | POA: Insufficient documentation

## 2012-01-17 DIAGNOSIS — S39012A Strain of muscle, fascia and tendon of lower back, initial encounter: Secondary | ICD-10-CM

## 2012-01-17 DIAGNOSIS — S335XXA Sprain of ligaments of lumbar spine, initial encounter: Secondary | ICD-10-CM | POA: Insufficient documentation

## 2012-01-17 DIAGNOSIS — S161XXA Strain of muscle, fascia and tendon at neck level, initial encounter: Secondary | ICD-10-CM

## 2012-01-17 DIAGNOSIS — E7800 Pure hypercholesterolemia, unspecified: Secondary | ICD-10-CM | POA: Insufficient documentation

## 2012-01-17 DIAGNOSIS — J45909 Unspecified asthma, uncomplicated: Secondary | ICD-10-CM | POA: Insufficient documentation

## 2012-01-17 MED ORDER — HYDROCODONE-ACETAMINOPHEN 5-325 MG PO TABS: ORAL_TABLET | ORAL | Status: DC

## 2012-01-17 MED ORDER — HYDROCODONE-ACETAMINOPHEN 5-325 MG PO TABS
2.0000 | ORAL_TABLET | Freq: Once | ORAL | Status: AC
  Administered 2012-01-17: 2 via ORAL
  Filled 2012-01-17: qty 2

## 2012-01-17 NOTE — Discharge Instructions (Signed)
 Cervical Sprain A cervical sprain is an injury in the neck in which the ligaments are stretched or torn. The ligaments are the tissues that hold the bones of the neck (vertebrae) in place.Cervical sprains can range from very mild to very severe. Most cervical sprains get better in 1 to 3 weeks, but it depends on the cause and extent of the injury. Severe cervical sprains can cause the neck vertebrae to be unstable. This can lead to damage of the spinal cord and can result in serious nervous system problems. Your caregiver will determine whether your cervical sprain is mild or severe. CAUSES  Severe cervical sprains may be caused by:  Contact sport injuries (football, rugby, wrestling, hockey, auto racing, gymnastics, diving, martial arts, boxing).  Motor vehicle collisions.  Whiplash injuries. This means the neck is forcefully whipped backward and forward.  Falls. Mild cervical sprains may be caused by:   Awkward positions, such as cradling a telephone between your ear and shoulder.  Sitting in a chair that does not offer proper support.  Working at a poorly Marketing executive station.  Activities that require looking up or down for long periods of time. SYMPTOMS   Pain, soreness, stiffness, or a burning sensation in the front, back, or sides of the neck. This discomfort may develop immediately after injury or it may develop slowly and not begin for 24 hours or more after an injury.  Pain or tenderness directly in the middle of the back of the neck.  Shoulder or upper back pain.  Limited ability to move the neck.  Headache.  Dizziness.  Weakness, numbness, or tingling in the hands or arms.  Muscle spasms.  Difficulty swallowing or chewing.  Tenderness and swelling of the neck. DIAGNOSIS  Most of the time, your caregiver can diagnose this problem by taking your history and doing a physical exam. Your caregiver will ask about any known problems, such as arthritis in the neck  or a previous neck injury. X-rays may be taken to find out if there are any other problems, such as problems with the bones of the neck. However, an X-ray often does not reveal the full extent of a cervical sprain. Other tests such as a computed tomography (CT) scan or magnetic resonance imaging (MRI) may be needed. TREATMENT  Treatment depends on the severity of the cervical sprain. Mild sprains can be treated with rest, keeping the neck in place (immobilization), and pain medicines. Severe cervical sprains need immediate immobilization and an appointment with an orthopedist or neurosurgeon. Several treatment options are available to help with pain, muscle spasms, and other symptoms. Your caregiver may prescribe:  Medicines, such as pain relievers, numbing medicines, or muscle relaxants.  Physical therapy. This can include stretching exercises, strengthening exercises, and posture training. Exercises and improved posture can help stabilize the neck, strengthen muscles, and help stop symptoms from returning.  A neck collar to be worn for short periods of time. Often, these collars are worn for comfort. However, certain collars may be worn to protect the neck and prevent further worsening of a serious cervical sprain. HOME CARE INSTRUCTIONS   Put ice on the injured area.  Put ice in a plastic bag.  Place a towel between your skin and the bag.  Leave the ice on for 15 to 20 minutes, 3 to 4 times a day.  Only take over-the-counter or prescription medicines for pain, discomfort, or fever as directed by your caregiver.  Keep all follow-up appointments as directed by your  caregiver.  Keep all physical therapy appointments as directed by your caregiver.  If a neck collar is prescribed, wear it as directed by your caregiver.  Do not drive while wearing a neck collar.  Make any needed adjustments to your work station to promote good posture.  Avoid positions and activities that make your  symptoms worse.  Warm up and stretch before being active to help prevent problems. SEEK MEDICAL CARE IF:   Your pain is not controlled with medicine.  You are unable to decrease your pain medicine over time as planned.  Your activity level is not improving as expected. SEEK IMMEDIATE MEDICAL CARE IF:   You develop any bleeding, stomach upset, or signs of an allergic reaction to your medicine.  Your symptoms get worse.  You develop new, unexplained symptoms.  You have numbness, tingling, weakness, or paralysis in any part of your body. MAKE SURE YOU:   Understand these instructions.  Will watch your condition.  Will get help right away if you are not doing well or get worse. Document Released: 11/12/2006 Document Revised: 04/09/2011 Document Reviewed: 10/18/2010 [facility] Patient Information 2013 [place], [place].   Lumbosacral Strain Lumbosacral strain is one of the most common causes of back pain. There are many causes of back pain. Most are not serious conditions. CAUSES  Your backbone (spinal column) is made up of 24 main vertebral bodies, the sacrum, and the coccyx. These are held together by muscles and tough, fibrous tissue (ligaments). Nerve roots pass through the openings between the vertebrae. A sudden move or injury to the back may cause injury to, or pressure on, these nerves. This may result in localized back pain or pain movement (radiation) into the buttocks, down the leg, and into the foot. Sharp, shooting pain from the buttock down the back of the leg (sciatica) is frequently associated with a ruptured (herniated) disk. Pain may be caused by muscle spasm alone. Your caregiver can often find the cause of your pain by the details of your symptoms and an exam. In some cases, you may need tests (such as X-rays). Your caregiver will work with you to decide if any tests are needed based on your specific exam. HOME CARE INSTRUCTIONS   Avoid an underactive lifestyle. Active  exercise, as directed by your caregiver, is your greatest weapon against back pain.  Avoid hard physical activities (tennis, racquetball, waterskiing) if you are not in proper physical condition for it. This may aggravate or create problems.  If you have a back problem, avoid sports requiring sudden body movements. Swimming and walking are generally safer activities.  Maintain good posture.  Avoid becoming overweight (obese).  Use bed rest for only the most extreme, sudden (acute) episode. Your caregiver will help you determine how much bed rest is necessary.  For acute conditions, you may put ice on the injured area.  Put ice in a plastic bag.  Place a towel between your skin and the bag.  Leave the ice on for 15 to 20 minutes at a time, every 2 hours, or as needed.  After you are improved and more active, it may help to apply heat for 30 minutes before activities. See your caregiver if you are having pain that lasts longer than expected. Your caregiver can advise appropriate exercises or therapy if needed. With conditioning, most back problems can be avoided. SEEK IMMEDIATE MEDICAL CARE IF:   You have numbness, tingling, weakness, or problems with the use of your arms or legs.  You  experience severe back pain not relieved with medicines.  There is a change in bowel or bladder control.  You have increasing pain in any area of the body, including your belly (abdomen).  You notice shortness of breath, dizziness, or feel faint.  You feel sick to your stomach (nauseous), are throwing up (vomiting), or become sweaty.  You notice discoloration of your toes or legs, or your feet get very cold.  Your back pain is getting worse.  You have a fever. MAKE SURE YOU:   Understand these instructions.  Will watch your condition.  Will get help right away if you are not doing well or get worse. Document Released: 10/25/2004 Document Revised: 04/09/2011 Document Reviewed:  04/16/2008 [facility] Patient Information 2013 [place], [place].    Narcotic and benzodiazepine use may cause drowsiness, slowed breathing or dependence.  Please use with caution and do not drive, operate machinery or watch young children alone while taking them.  Taking combinations of these medications or drinking alcohol will potentiate these effects.
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
## 2012-01-17 NOTE — ED Notes (Signed)
Discharge instructions reviewed. All questions answered. Rx given x1.  
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
## 2012-01-29 ENCOUNTER — Encounter (HOSPITAL_COMMUNITY): Payer: Self-pay | Admitting: Emergency Medicine

## 2012-01-29 ENCOUNTER — Emergency Department (HOSPITAL_COMMUNITY): Payer: Medicare Other

## 2012-01-29 ENCOUNTER — Emergency Department (HOSPITAL_COMMUNITY)
Admission: EM | Admit: 2012-01-29 | Discharge: 2012-01-29 | Disposition: A | Discharge status: Home / Self Care | Payer: Medicare Other | Attending: Emergency Medicine | Admitting: Emergency Medicine

## 2012-01-29 DIAGNOSIS — Z79899 Other long term (current) drug therapy: Secondary | ICD-10-CM | POA: Insufficient documentation

## 2012-01-29 DIAGNOSIS — IMO0002 Reserved for concepts with insufficient information to code with codable children: Secondary | ICD-10-CM | POA: Insufficient documentation

## 2012-01-29 DIAGNOSIS — M549 Dorsalgia, unspecified: Secondary | ICD-10-CM

## 2012-01-29 DIAGNOSIS — Y9389 Activity, other specified: Secondary | ICD-10-CM | POA: Insufficient documentation

## 2012-01-29 DIAGNOSIS — M542 Cervicalgia: Secondary | ICD-10-CM

## 2012-01-29 DIAGNOSIS — H409 Unspecified glaucoma: Secondary | ICD-10-CM | POA: Insufficient documentation

## 2012-01-29 DIAGNOSIS — Y92410 Unspecified street and highway as the place of occurrence of the external cause: Secondary | ICD-10-CM | POA: Insufficient documentation

## 2012-01-29 DIAGNOSIS — J45909 Unspecified asthma, uncomplicated: Secondary | ICD-10-CM | POA: Insufficient documentation

## 2012-01-29 MED ORDER — TRAMADOL HCL 50 MG PO TABS: 50.0000 mg | ORAL_TABLET | Freq: Four times a day (QID) | ORAL | Status: DC | PRN

## 2012-01-29 MED ORDER — ACETAMINOPHEN 325 MG PO TABS
650.0000 mg | ORAL_TABLET | Freq: Once | ORAL | Status: AC
  Administered 2012-01-29: 650 mg via ORAL
  Filled 2012-01-29: qty 2
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
## 2012-01-30 NOTE — ED Provider Notes (Signed)
Medical screening examination/treatment/procedure(s) were performed by non-physician practitioner and as supervising physician I was immediately available for consultation/collaboration.  Apple Dearmas T Aniyla Harling, MD 01/30/12 1516 

## 2012-09-19 ENCOUNTER — Other Ambulatory Visit: Payer: Self-pay

## 2012-09-19 DIAGNOSIS — Z1231 Encounter for screening mammogram for malignant neoplasm of breast: Secondary | ICD-10-CM

## 2012-09-28 ENCOUNTER — Encounter (HOSPITAL_COMMUNITY): Payer: Self-pay | Admitting: Emergency Medicine

## 2012-09-28 ENCOUNTER — Emergency Department (INDEPENDENT_AMBULATORY_CARE_PROVIDER_SITE_OTHER)
Admission: EM | Admit: 2012-09-28 | Discharge: 2012-09-28 | Disposition: A | Discharge status: Home / Self Care | Payer: Medicare Other | Source: Home / Self Care | Attending: Emergency Medicine | Admitting: Emergency Medicine

## 2012-09-28 ENCOUNTER — Emergency Department (HOSPITAL_COMMUNITY): Payer: Medicare Other

## 2012-09-28 ENCOUNTER — Emergency Department (HOSPITAL_COMMUNITY)
Admission: EM | Admit: 2012-09-28 | Discharge: 2012-09-28 | Disposition: A | Discharge status: Home / Self Care | Payer: Medicare Other | Attending: Emergency Medicine | Admitting: Emergency Medicine

## 2012-09-28 DIAGNOSIS — Z8669 Personal history of other diseases of the nervous system and sense organs: Secondary | ICD-10-CM | POA: Insufficient documentation

## 2012-09-28 DIAGNOSIS — Z8639 Personal history of other endocrine, nutritional and metabolic disease: Secondary | ICD-10-CM | POA: Insufficient documentation

## 2012-09-28 DIAGNOSIS — R0989 Other specified symptoms and signs involving the circulatory and respiratory systems: Secondary | ICD-10-CM

## 2012-09-28 DIAGNOSIS — R6889 Other general symptoms and signs: Secondary | ICD-10-CM

## 2012-09-28 DIAGNOSIS — J029 Acute pharyngitis, unspecified: Secondary | ICD-10-CM | POA: Insufficient documentation

## 2012-09-28 DIAGNOSIS — Z880 Allergy status to penicillin: Secondary | ICD-10-CM | POA: Insufficient documentation

## 2012-09-28 DIAGNOSIS — Z79899 Other long term (current) drug therapy: Secondary | ICD-10-CM | POA: Insufficient documentation

## 2012-09-28 DIAGNOSIS — J45909 Unspecified asthma, uncomplicated: Secondary | ICD-10-CM | POA: Insufficient documentation

## 2012-09-28 DIAGNOSIS — Z862 Personal history of diseases of the blood and blood-forming organs and certain disorders involving the immune mechanism: Secondary | ICD-10-CM | POA: Insufficient documentation

## 2012-09-28 MED ORDER — GI COCKTAIL ~~LOC~~
30.0000 mL | Freq: Once | ORAL | Status: AC
  Administered 2012-09-28: 30 mL via ORAL
  Filled 2012-09-28: qty 30

## 2012-09-28 NOTE — ED Provider Notes (Signed)
Medical screening examination/treatment/procedure(s) were performed by non-physician practitioner and as supervising physician I was immediately available for consultation/collaboration.  Leslee Home, M.D.  Reuben Likes, MD 09/28/12 [phone]

## 2012-09-28 NOTE — ED Notes (Signed)
Hooked pt back up to the monitor.

## 2012-09-28 NOTE — ED Notes (Signed)
Pt sent from urgent care for further eval of possible piece of chicken stuck in throat. Pt reports feels like stuck on right side of throat. Pt has since eaten other foods and drinking fluids without difficulty. Pt talking in complete sentences.

## 2012-09-28 NOTE — ED Provider Notes (Signed)
CSN: [id number]     Arrival date & time 09/28/12  1032 History   First MD Initiated Contact with Patient 09/28/12 1123     Chief Complaint  Patient presents with  . Foreign Body   (Consider location/radiation/quality/duration/timing/severity/associated sxs/prior Treatment) HPI Comments: Patient presents emergency department with chief complaint of feeling of foreign body stuck in throat. Patient states that she was eating chicken last night, and thinks it is associated with her throat. She states that after she left the restaurant she noticed a sore throat, and a progressively worsened. She states that she tried taking her finger down her throat trying to dislodge whatever was there, was unable to feel any foreign body. She states that she then gagged herself and vomited. She was seen at urgent care this morning, and was given another Coke. She denies any difficulty eating or drinking, no difficulty breathing. She states that her throat just feels "irritated."  The history is provided by the patient. No language interpreter was used.    Past Medical History  Diagnosis Date  . Glaucoma   . Asthma   . High cholesterol    Past Surgical History  Procedure Laterality Date  . Cervical discectomy    . Ectopic pregnancy surgery     No family history on file. History  Substance Use Topics  . Smoking status: Never Smoker   . Smokeless tobacco: Not on file  . Alcohol Use: No   OB History   Grav Para Term Preterm Abortions TAB SAB Ect Mult Living                 Review of Systems  All other systems reviewed and are negative.    Allergies  Penicillins  Home Medications   Current Outpatient Rx  Name  Route  Sig  Dispense  Refill  . albuterol (PROVENTIL HFA;VENTOLIN HFA) 108 (90 BASE) MCG/ACT inhaler   Inhalation   Inhale 2 puffs into the lungs every 6 (six) hours as needed for wheezing or shortness of breath.          . bimatoprost (LUMIGAN) 0.03 % ophthalmic solution   Both  Eyes   Place 1 drop into both eyes at bedtime.         . brinzolamide (AZOPT) 1 % ophthalmic suspension   Both Eyes   Place 1 drop into both eyes 2 (two) times daily.         . cycloSPORINE (RESTASIS) 0.05 % ophthalmic emulsion   Both Eyes   Place 1 drop into both eyes 2 (two) times daily.          Marland Kitchen HYDROcodone-acetaminophen (NORCO/VICODIN) 5-325 MG per tablet   Oral   Take 1-2 tablets by mouth every 6 (six) hours as needed for pain.         . traMADol (ULTRAM) 50 MG tablet   Oral   Take 1 tablet (50 mg total) by mouth every 6 (six) hours as needed for pain.   15 tablet   0    BP 162/76  Pulse 63  Temp(Src) 97.9 F (36.6 C) (Oral)  Resp 18  Ht 5\' 7"  (1.702 m)  Wt 172 lb (78.019 kg)  BMI 26.93 kg/m2  SpO2 99% Physical Exam  Nursing note and vitals reviewed. Constitutional: She is oriented to person, place, and time. She appears well-developed and well-nourished.  HENT:  Head: Normocephalic and atraumatic.  Airway is intact, no evidence of foreign body, mildly erythematous oropharynx, no exudates, no evidence of  abscess, uvula is midline, no signs of Ludwig angina  Eyes: Conjunctivae and EOM are normal. Pupils are equal, round, and reactive to light.  Neck: Normal range of motion. Neck supple.  Cardiovascular: Normal rate and regular rhythm.  Exam reveals no gallop and no friction rub.   No murmur heard. Pulmonary/Chest: Effort normal and breath sounds normal. No respiratory distress. She has no wheezes. She has no rales. She exhibits no tenderness.  Abdominal: Soft. Bowel sounds are normal. She exhibits no distension and no mass. There is no tenderness. There is no rebound and no guarding.  Musculoskeletal: Normal range of motion. She exhibits no edema and no tenderness.  Neurological: She is alert and oriented to person, place, and time.  Skin: Skin is warm and dry.  Psychiatric: She has a normal mood and affect. Her behavior is normal. Judgment and thought  content normal.    ED Course  Procedures (including critical care time) Results for orders placed during the hospital encounter of 06/18/11  BASIC METABOLIC PANEL      Result Value Range   Sodium 141  135 - 145 mEq/L   Potassium 3.7  3.5 - 5.1 mEq/L   Chloride 106  96 - 112 mEq/L   CO2 25  19 - 32 mEq/L   Glucose, Bld 79  70 - 99 mg/dL   BUN 8  6 - 23 mg/dL   Creatinine, Ser 1.61  0.50 - 1.10 mg/dL   Calcium 9.9  8.4 - 09.6 mg/dL   GFR calc non Af Amer 86 (*) >90 mL/min   GFR calc Af Amer >90  >90 mL/min  OCCULT BLOOD, POC DEVICE      Result Value Range   Fecal Occult Bld NEGATIVE     Dg Neck Soft Tissue  09/28/2012   *RADIOLOGY REPORT*  Clinical Data: Possible foreign body  NECK SOFT TISSUES - 1+ VIEW  Comparison: 12/ 31/13  Findings: 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  No prevertebral soft tissue changes are seen.  The epiglottis and aryepiglottic folds are within normal limits.  No radiopaque foreign body is seen.  IMPRESSION: No evidence of foreign body noted.   Original Report Authenticated By: Alcide Clever, M.D.     MDM   1. Foreign body sensation in throat    Patient complaining of foreign body in throat x1 day. Doubt that there is foreign body, but believe that the patient may have irritated the esophagus or oropharynx, suspect of this maybe the cause of the symptoms. Patient feels better with GI cocktail, but states that she can still feel the sensation. She's not having drooling, and no difficulty eating or drinking. Airways intact. Patient is stable and ready for discharge.  Patient seen by and discussed with Dr. Manus Gunning.   Roxy Horseman, PA-C 09/28/12 [phone]

## 2012-09-28 NOTE — ED Provider Notes (Signed)
CSN: [id number]     Arrival date & time 09/28/12  0910 History   First MD Initiated Contact with Patient 09/28/12 [phone]     Chief Complaint  Patient presents with  . Swallowed Foreign Body   (Consider location/radiation/quality/duration/timing/severity/associated sxs/prior Treatment) HPI Comments: 67 year old female presents complaining of foreign body sensation in her throat since the last night. She was eating chicken and felt a piece get stuck in her throat. She has tried reaching her finger down her throat to remove it, eating, and drinking but this has not removed the foreign body. She has a history of this and did have a foreign body in her throat about 2 years ago she was finally able to remove by coughing very hard. She also feels the right side of her throat swelling. Apart from this, she has no symptoms. She suffers sig. No fever, chills, NVD, chest pain, heartburn, cough, SOB  Patient is a 67 y.o. female presenting with foreign body swallowed.  Swallowed Foreign Body Pertinent negatives include no chest pain, no abdominal pain and no shortness of breath.    Past Medical History  Diagnosis Date  . Glaucoma   . Asthma   . High cholesterol    Past Surgical History  Procedure Laterality Date  . Cervical discectomy    . Ectopic pregnancy surgery     History reviewed. No pertinent family history. History  Substance Use Topics  . Smoking status: Never Smoker   . Smokeless tobacco: Not on file  . Alcohol Use: No   OB History   Grav Para Term Preterm Abortions TAB SAB Ect Mult Living                 Review of Systems  Constitutional: Negative for fever and chills.  HENT: Positive for sore throat.   Eyes: Negative for visual disturbance.  Respiratory: Negative for cough and shortness of breath.   Cardiovascular: Negative for chest pain, palpitations and leg swelling.  Gastrointestinal: Negative for nausea, vomiting and abdominal pain.       See history of present illness   Endocrine: Negative for polydipsia and polyuria.  Genitourinary: Negative for dysuria, urgency and frequency.  Musculoskeletal: Negative for myalgias and arthralgias.  Skin: Negative for rash.  Neurological: Negative for dizziness, weakness and light-headedness.    Allergies  Penicillins  Home Medications   Current Outpatient Rx  Name  Route  Sig  Dispense  Refill  . albuterol (PROVENTIL HFA;VENTOLIN HFA) 108 (90 BASE) MCG/ACT inhaler   Inhalation   Inhale 2 puffs into the lungs every 6 (six) hours as needed. For wheezing         . bimatoprost (LUMIGAN) 0.03 % ophthalmic solution   Both Eyes   Place 1 drop into both eyes at bedtime.         . brinzolamide (AZOPT) 1 % ophthalmic suspension   Both Eyes   Place 1 drop into both eyes 2 (two) times daily.         . cycloSPORINE (RESTASIS) 0.05 % ophthalmic emulsion   Both Eyes   Place 1 drop into both eyes 2 (two) times daily.          Marland Kitchen HYDROcodone-acetaminophen (NORCO/VICODIN) 5-325 MG per tablet      1-2 tablets po q 6 hours prn moderate to severe pain   20 tablet   0   . traMADol (ULTRAM) 50 MG tablet   Oral   Take 1 tablet (50 mg total) by mouth every 6 (  six) hours as needed for pain.   15 tablet   0    BP 159/73  Pulse 67  Temp(Src) 98.6 F (37 C) (Oral)  Resp 17  SpO2 100% Physical Exam  Nursing note and vitals reviewed. Constitutional: She is oriented to person, place, and time. She appears well-developed and well-nourished. No distress.  HENT:  Head: Normocephalic and atraumatic.  Mouth/Throat: Uvula is midline, oropharynx is clear and moist and mucous membranes are normal. No oropharyngeal exudate.  No foreign body visualized  Neurological: She is alert and oriented to person, place, and time. Coordination normal.  Skin: Skin is warm and dry. No rash noted. She is not diaphoretic.  Psychiatric: She has a normal mood and affect. Judgment normal.    ED Course  Procedures (including critical  care time) Labs Review Labs Reviewed - No data to display Imaging Review No results found.  MDM   1. Sensation of foreign body in throat    No FB able to be visualized.  Had her drink a coca cola, did not resolve.  There may or may not actually be something stuck.  I have advised her to go to be seen in the ED where a scope can be done to remove a foreign body, she says She will go home and drink a few more cokes and see if symptoms resolve.  If the pain worsens or does not resolve she will return to ED as we will not be able to do anything about this here.      Graylon Good, PA-C 09/28/12 1015

## 2012-09-28 NOTE — ED Provider Notes (Signed)
Medical screening examination/treatment/procedure(s) were conducted as a shared visit with non-physician practitioner(s) and myself.  I personally evaluated the patient during the encounter  Foreign body sensation in throat since eating boneless chicken last night. No difficulty breathing or swallowing.  Tolerating liquids and solids.  No drooling.   Oropharynx clear, no wheezing, speaking in full sentences.  Glynn Octave, MD 09/28/12 1900

## 2012-09-28 NOTE — ED Notes (Signed)
C/o swallowing a piece of chicken last night while eating.  Patient feels as if the chicken is still enlarged in her throat.  Patient states she has tried eating other items and they have went down.  States fluids have been going down also.

## 2012-09-28 NOTE — ED Notes (Signed)
Patient returned from X-ray 

## 2012-09-28 NOTE — ED Notes (Signed)
Patient transported to X-ray 

## 2012-10-20 ENCOUNTER — Ambulatory Visit
Admission: RE | Admit: 2012-10-20 | Discharge: 2012-10-20 | Disposition: A | Discharge status: Home / Self Care | Payer: Medicare Other | Source: Ambulatory Visit

## 2012-10-20 ENCOUNTER — Ambulatory Visit: Payer: Medicare Other

## 2012-10-20 DIAGNOSIS — Z1231 Encounter for screening mammogram for malignant neoplasm of breast: Secondary | ICD-10-CM

## 2013-08-20 ENCOUNTER — Telehealth: Payer: Self-pay

## 2013-08-20 ENCOUNTER — Ambulatory Visit (INDEPENDENT_AMBULATORY_CARE_PROVIDER_SITE_OTHER): Payer: Medicare Other | Admitting: Internal Medicine

## 2013-08-20 ENCOUNTER — Ambulatory Visit (INDEPENDENT_AMBULATORY_CARE_PROVIDER_SITE_OTHER): Payer: Medicare Other

## 2013-08-20 VITALS — BP 130/72 | HR 76 | Temp 98.3°F | Resp 18 | Ht 66.5 in | Wt 167.0 lb

## 2013-08-20 DIAGNOSIS — M545 Low back pain, unspecified: Secondary | ICD-10-CM

## 2013-08-20 DIAGNOSIS — M25579 Pain in unspecified ankle and joints of unspecified foot: Secondary | ICD-10-CM

## 2013-08-20 DIAGNOSIS — M79605 Pain in left leg: Secondary | ICD-10-CM

## 2013-08-20 DIAGNOSIS — M129 Arthropathy, unspecified: Secondary | ICD-10-CM

## 2013-08-20 DIAGNOSIS — M25572 Pain in left ankle and joints of left foot: Secondary | ICD-10-CM

## 2013-08-20 DIAGNOSIS — M1990 Unspecified osteoarthritis, unspecified site: Secondary | ICD-10-CM

## 2013-08-20 LAB — URIC ACID: Uric Acid, Serum: 4.8 mg/dL (ref 2.4–7.0)

## 2013-08-20 LAB — POCT CBC
Granulocyte percent: 61.5 %G (ref 37–80)
HCT, POC: 44.6 % (ref 37.7–47.9)
HEMOGLOBIN: 14.1 g/dL (ref 12.2–16.2)
LYMPH, POC: 2.5 (ref 0.6–3.4)
MCH, POC: 27.1 pg (ref 27–31.2)
MCHC: 31.7 g/dL — AB (ref 31.8–35.4)
MCV: 85.6 fL (ref 80–97)
MID (cbc): 0.5 (ref 0–0.9)
MPV: 7.8 fL (ref 0–99.8)
POC GRANULOCYTE: 4.9 (ref 2–6.9)
POC LYMPH PERCENT: 32.2 %L (ref 10–50)
POC MID %: 6.3 % (ref 0–12)
Platelet Count, POC: 299 10*3/uL (ref 142–424)
RBC: 5.21 M/uL (ref 4.04–5.48)
RDW, POC: 15.5 %
WBC: 7.9 10*3/uL (ref 4.6–10.2)

## 2013-08-20 LAB — POCT SEDIMENTATION RATE: POCT SED RATE: 18 mm/h (ref 0–22)

## 2013-08-20 NOTE — Progress Notes (Signed)
   Subjective:    Patient ID: Kimberly Garza, female    DOB: 08/30/1945, 68 y.o.   MRN: [id number]  HPI    Review of Systems     Objective:   Physical Exam  Constitutional: She is oriented to person, place, and time. She appears well-developed and well-nourished. No distress.  HENT:  Head: Normocephalic.  Right Ear: External ear normal.  Left Ear: External ear normal.  Eyes: Conjunctivae and EOM are normal. Pupils are equal, round, and reactive to light.  Neck: Normal range of motion. Neck supple.  Cardiovascular: Normal rate, regular rhythm and normal heart sounds.   Pulmonary/Chest: Effort normal and breath sounds normal.  Abdominal: Soft. She exhibits mass. There is no tenderness.  Musculoskeletal:       Left ankle: She exhibits swelling and abnormal pulse. She exhibits normal range of motion, no ecchymosis, no deformity and no laceration. Tenderness. Lateral malleolus tenderness found. No medial malleolus, no head of 5th metatarsal and no proximal fibula tenderness found. Achilles tendon normal. Achilles tendon exhibits normal Thompson's test results.       Lumbar back: She exhibits decreased range of motion, tenderness, bony tenderness, pain and spasm. She exhibits no swelling, no edema, no deformity, no laceration and normal pulse.       Feet:  Lymphadenopathy:    She has no cervical adenopathy.  Neurological: She is alert and oriented to person, place, and time. She has normal strength and normal reflexes. No cranial nerve deficit or sensory deficit. She displays a negative Romberg sign.  Skin: No rash noted.  Psychiatric: She has a normal mood and affect. Her behavior is normal. Judgment and thought content normal.   Results for orders placed in visit on 08/20/13  POCT CBC      Result Value Ref Range   WBC 7.9  4.6 - 10.2 K/uL   Lymph, poc 2.5  0.6 - 3.4   POC LYMPH PERCENT 32.2  10 - 50 %L   MID (cbc) 0.5  0 - 0.9   POC MID % 6.3  0 - 12 %M   POC Granulocyte 4.9  2  - 6.9   Granulocyte percent 61.5  37 - 80 %G   RBC 5.21  4.04 - 5.48 M/uL   Hemoglobin 14.1  12.2 - 16.2 g/dL   HCT, POC 82.944.6  56.237.7 - 47.9 %   MCV 85.6  80 - 97 fL   MCH, POC 27.1  27 - 31.2 pg   MCHC 31.7 (*) 31.8 - 35.4 g/dL   RDW, POC 13.015.5     Platelet Count, POC 299  142 - 424 K/uL   MPV 7.8  0 - 99.8 fL          Assessment & Plan:  Back pain/Sciatica/Left ankle pain

## 2013-08-20 NOTE — Telephone Encounter (Signed)
Pt [facility], said that she could not open the disc that was given to her

## 2013-08-20 NOTE — Patient Instructions (Signed)
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
## 2013-08-21 NOTE — Telephone Encounter (Signed)
Lm new disc at front desk. Gave copy to xray to make another copy for pt. Pt advised.

## 2013-10-01 ENCOUNTER — Other Ambulatory Visit: Payer: Self-pay

## 2013-10-01 DIAGNOSIS — Z1231 Encounter for screening mammogram for malignant neoplasm of breast: Secondary | ICD-10-CM

## 2013-10-21 ENCOUNTER — Ambulatory Visit: Payer: Medicare Other

## 2013-10-26 ENCOUNTER — Ambulatory Visit
Admission: RE | Admit: 2013-10-26 | Discharge: 2013-10-26 | Disposition: A | Discharge status: Home / Self Care | Payer: 59 | Source: Ambulatory Visit

## 2013-10-26 DIAGNOSIS — Z1231 Encounter for screening mammogram for malignant neoplasm of breast: Secondary | ICD-10-CM

## 2013-11-13 ENCOUNTER — Other Ambulatory Visit: Payer: Self-pay

## 2014-07-26 ENCOUNTER — Other Ambulatory Visit: Payer: Self-pay

## 2014-10-12 ENCOUNTER — Other Ambulatory Visit: Payer: Self-pay

## 2014-10-12 DIAGNOSIS — Z1231 Encounter for screening mammogram for malignant neoplasm of breast: Secondary | ICD-10-CM

## 2014-11-01 ENCOUNTER — Ambulatory Visit
Admission: RE | Admit: 2014-11-01 | Discharge: 2014-11-01 | Disposition: A | Discharge status: Home / Self Care | Payer: Medicare Other | Source: Ambulatory Visit

## 2014-11-01 DIAGNOSIS — Z1231 Encounter for screening mammogram for malignant neoplasm of breast: Secondary | ICD-10-CM

## 2015-07-09 ENCOUNTER — Emergency Department (HOSPITAL_COMMUNITY)
Admission: EM | Admit: 2015-07-09 | Discharge: 2015-07-10 | Disposition: A | Discharge status: Home / Self Care | Payer: Medicare Other | Attending: Emergency Medicine | Admitting: Emergency Medicine

## 2015-07-09 ENCOUNTER — Encounter (HOSPITAL_COMMUNITY): Payer: Self-pay | Admitting: Emergency Medicine

## 2015-07-09 ENCOUNTER — Emergency Department (HOSPITAL_COMMUNITY): Payer: Medicare Other

## 2015-07-09 DIAGNOSIS — R0602 Shortness of breath: Secondary | ICD-10-CM | POA: Diagnosis present

## 2015-07-09 DIAGNOSIS — J189 Pneumonia, unspecified organism: Secondary | ICD-10-CM | POA: Insufficient documentation

## 2015-07-09 DIAGNOSIS — Z87891 Personal history of nicotine dependence: Secondary | ICD-10-CM | POA: Diagnosis not present

## 2015-07-09 DIAGNOSIS — Z79899 Other long term (current) drug therapy: Secondary | ICD-10-CM | POA: Insufficient documentation

## 2015-07-09 DIAGNOSIS — J45901 Unspecified asthma with (acute) exacerbation: Secondary | ICD-10-CM | POA: Insufficient documentation

## 2015-07-09 MED ORDER — ALBUTEROL SULFATE (2.5 MG/3ML) 0.083% IN NEBU
5.0000 mg | INHALATION_SOLUTION | Freq: Once | RESPIRATORY_TRACT | Status: DC
  Filled 2015-07-09 (×2): qty 6

## 2015-07-09 NOTE — ED Notes (Signed)
Pt from home with complaints of chills and sob x 1 week. Pt has a history of asthma, but pt states she has not had an attack in years. Pt states she has been working in her yard more than normal this week. Pt has a dry cough at time of assessment

## 2015-07-09 NOTE — ED Provider Notes (Signed)
CSN: [id number]     Arrival date & time 07/09/15  2038 History  By signing my name below, I, Phillis Haggis, attest that this documentation has been prepared under the direction and in the presence of Tomasita Crumble, MD. Electronically Signed: Phillis Haggis, ED Scribe. 07/09/2015. 11:37 PM.    Chief Complaint  Patient presents with  . Shortness of Breath   The history is provided by the patient. No language interpreter was used.  HPI Comments: Kimberly Garza is a 70 y.o. female with a hx of asthma who presents to the Emergency Department complaining of gradually worsening dry cough onset 2 days ago. Pt reports associated SOB, fever, chills, and wheezing. Pt reports that she has used an inhaler to no relief. She states that she has been working out in the yard more this week, which she believes may have exacerbated her asthma. She states that she has not had an asthma attack in years. She was also bitten by a mosquito on her neck and believes she may have had an allergic reaction over the area. Pt was given a breathing treatment in triage to mild relief. She denies nausea or vomiting.   Past Medical History  Diagnosis Date  . Glaucoma   . Asthma   . High cholesterol    Past Surgical History  Procedure Laterality Date  . Cervical discectomy    . Ectopic pregnancy surgery     No family history on file. Social History  Substance Use Topics  . Smoking status: Former Smoker -- 0.10 packs/day    Types: Cigarettes    Start date: 08/21/1963    Quit date: 08/20/1968  . Smokeless tobacco: Never Used  . Alcohol Use: No   OB History    No data available     Review of Systems 10 Systems reviewed and all are negative for acute change except as noted in the HPI.  Allergies  Penicillins and Levaquin  Home Medications   Prior to Admission medications   Medication Sig Start Date End Date Taking? Authorizing Provider  albuterol (PROVENTIL HFA;VENTOLIN HFA) 108 (90 BASE) MCG/ACT inhaler  Inhale 2 puffs into the lungs every 6 (six) hours as needed for wheezing or shortness of breath.    Yes Historical Provider, MD  bimatoprost (LUMIGAN) 0.03 % ophthalmic solution Place 1 drop into both eyes at bedtime.   Yes Historical Provider, MD  brimonidine (ALPHAGAN) 0.15 % ophthalmic solution Place 1 drop into the right eye 2 (two) times daily.   Yes Historical Provider, MD  brinzolamide (AZOPT) 1 % ophthalmic suspension Place 1 drop into both eyes 2 (two) times daily.   Yes Historical Provider, MD  Cholecalciferol (VITAMIN D-1000 MAX ST) 1000 units tablet Take 1,000 Units by mouth daily.   Yes Historical Provider, MD  vitamin E 400 UNIT capsule Take 400 Units by mouth daily.   Yes Historical Provider, MD   BP 176/96 mmHg  Pulse 92  Temp(Src) 98.2 F (36.8 C) (Oral)  Resp 20  Ht  (1.676 m)  Wt 158 lb (71.668 kg)  BMI 25.51 kg/m2  SpO2 96% Physical Exam  Constitutional: She is oriented to person, place, and time. She appears well-developed and well-nourished. No distress.  HENT:  Head: Normocephalic and atraumatic.  Nose: Nose normal.  Mouth/Throat: Oropharynx is clear and moist. No oropharyngeal exudate.  Eyes: Conjunctivae and EOM are normal. Pupils are equal, round, and reactive to light. No scleral icterus.  Neck: Normal range of motion. Neck supple. No  JVD present. No tracheal deviation present. No thyromegaly present.  Cardiovascular: Normal rate and normal heart sounds.  Exam reveals no gallop and no friction rub.   No murmur heard. irreg rhythm heard  Pulmonary/Chest: Effort normal and breath sounds normal. No respiratory distress. She has no wheezes. She exhibits no tenderness.  Abdominal: Soft. Bowel sounds are normal. She exhibits no distension and no mass. There is no tenderness. There is no rebound and no guarding.  Musculoskeletal: Normal range of motion. She exhibits no edema or tenderness.  Lymphadenopathy:    She has no cervical adenopathy.  Neurological: She  is alert and oriented to person, place, and time. No cranial nerve deficit. She exhibits normal muscle tone.  Skin: Skin is warm and dry. No rash noted. No erythema. No pallor.  Nursing note and vitals reviewed.   ED Course  Procedures (including critical care time) DIAGNOSTIC STUDIES: Oxygen Saturation is 96% on RA, normal by my interpretation.    COORDINATION OF CARE: 11:35 PM-Discussed treatment plan which includes breathing treatment and x-ray with pt at bedside and pt agreed to plan.    Labs Review Labs Reviewed - No data to display  Imaging Review Dg Chest 2 View  07/10/2015  CLINICAL DATA:  70 year old female with shortness of breath and productive cough EXAM: CHEST  2 VIEW COMPARISON:  Chest radiograph dated 09/01/2010 FINDINGS: The heart size and mediastinal contours are within normal limits. Both lungs are clear. The visualized skeletal structures are unremarkable. IMPRESSION: No active cardiopulmonary disease. Electronically Signed   By: Elgie CollardArash  Radparvar M.D.   On: 07/10/2015 00:33   I have personally reviewed and evaluated these images and lab results as part of my medical decision-making.   EKG Interpretation None      MDM   Final diagnoses:  None  Patient presents to the ED for worsening SOB over the past 2 days.  Apparently wheezing in triage has now resolved after duoneb.  She has sick contacts with her son who had pneumonia recently, will obtain CXR for evaluation.  Likely DC home with prednisone course and albuterol inhaler, if she needs one.   Chest x-ray is concerning for an early left lower lobe pneumonia. Patient was given 5 day course of azithromycin and prednisone, first dose given in emergency department. She appears well in no acute distress, vital signs were within her normal limits and she is safe for discharge.  I personally performed the services described in this documentation, which was scribed in my presence. The recorded information has been  reviewed and is accurate.     Tomasita CrumbleAdeleke Pressley Barsky, MD 07/10/15 [phone]

## 2015-07-10 DIAGNOSIS — J189 Pneumonia, unspecified organism: Secondary | ICD-10-CM | POA: Diagnosis not present

## 2015-07-10 MED ORDER — AZITHROMYCIN 250 MG PO TABS
500.0000 mg | ORAL_TABLET | Freq: Once | ORAL | Status: AC
  Administered 2015-07-10: 500 mg via ORAL
  Filled 2015-07-10: qty 2

## 2015-07-10 MED ORDER — PREDNISONE 20 MG PO TABS
60.0000 mg | ORAL_TABLET | Freq: Once | ORAL | Status: AC
  Administered 2015-07-10: 60 mg via ORAL
  Filled 2015-07-10: qty 3

## 2015-07-10 MED ORDER — AZITHROMYCIN 250 MG PO TABS: 250.0000 mg | ORAL_TABLET | Freq: Every day | ORAL | Status: DC

## 2015-07-10 MED ORDER — PREDNISONE 20 MG PO TABS: 60.0000 mg | ORAL_TABLET | Freq: Every day | ORAL | Status: DC

## 2015-07-10 NOTE — Discharge Instructions (Signed)
Asthma Attack Prevention Ms. Ricklefs, take antibiotics and prednisone as directed and see your primary care doctor within 3 days for close follow up. If symptoms worsen, come back to the ED immediately. Thank you. While you may not be able to control the fact that you have asthma, you can take actions to prevent asthma attacks. The best way to prevent asthma attacks is to maintain good control of your asthma. You can achieve this by:  Taking your medicines as directed.  Avoiding things that can irritate your airways or make your asthma symptoms worse (asthma triggers).  Keeping track of how well your asthma is controlled and of any changes in your symptoms.  Responding quickly to worsening asthma symptoms (asthma attack).  Seeking emergency care when it is needed. WHAT ARE SOME WAYS TO PREVENT AN ASTHMA ATTACK? Have a Plan Work with your health care provider to create a written plan for managing and treating your asthma attacks (asthma action plan). This plan includes:  A list of your asthma triggers and how you can avoid them.  Information on when medicines should be taken and when their dosages should be changed.  The use of a device that measures how well your lungs are working (peak flow meter). Monitor Your Asthma Use your peak flow meter and record your results in a journal every day. A drop in your peak flow numbers on one or more days may indicate the start of an asthma attack. This can happen even before you start to feel symptoms. You can prevent an asthma attack from getting worse by following the steps in your asthma action plan. Avoid Asthma Triggers Work with your asthma health care provider to find out what your asthma triggers are. This can be done by:  Allergy testing.  Keeping a journal that notes when asthma attacks occur and the factors that may have contributed to them.  Determining if there are other medical conditions that are making your asthma worse. Once you  have determined your asthma triggers, take steps to avoid them. This may include avoiding excessive or prolonged exposure to:  Dust. Have someone dust and vacuum your home for you once or twice a week. Using a high-efficiency particulate arrestance (HEPA) vacuum is best.  Smoke. This includes campfire smoke, forest fire smoke, and secondhand smoke from tobacco products.  Pet dander. Avoid contact with animals that you know you are allergic to.  Allergens from trees, grasses or pollens. Avoid spending a lot of time outdoors when pollen counts are high, and on very windy days.  Very cold, dry, or humid air.  Mold.  Foods that contain high amounts of sulfites.  Strong odors.  Outdoor air pollutants, such as Museum/gallery exhibitions officer.  Indoor air pollutants, such as aerosol sprays and fumes from household cleaners.  Household pests, including dust mites and cockroaches, and pest droppings.  Certain medicines, including NSAIDs. Always talk to your health care provider before stopping or starting any new medicines. Medicines Take over-the-counter and prescription medicines only as told by your health care provider. Many asthma attacks can be prevented by carefully following your medicine schedule. Taking your medicines correctly is especially important when you cannot avoid certain asthma triggers. Act Quickly If an asthma attack does happen, acting quickly can decrease how severe it is and how long it lasts. Take these steps:   Pay attention to your symptoms. If you are coughing, wheezing, or having difficulty breathing, do not wait to see if your symptoms go away on  their own. Follow your asthma action plan.  If you have followed your asthma action plan and your symptoms are not improving, call your health care provider or seek immediate medical care at the nearest hospital. It is important to note how often you need to use your fast-acting rescue inhaler. If you are using your rescue inhaler more  often, it may mean that your asthma is not under control. Adjusting your asthma treatment plan may help you to prevent future asthma attacks and help you to gain better control of your condition. HOW CAN I PREVENT AN ASTHMA ATTACK WHEN I EXERCISE? Follow advice from your health care provider about whether you should use your fast-acting inhaler before exercising. Many people with asthma experience exercise-induced bronchoconstriction (EIB). This condition often worsens during vigorous exercise in cold, humid, or dry environments. Usually, people with EIB can stay very active by pre-treating with a fast-acting inhaler before exercising.   This information is not intended to replace advice given to you by your health care provider. Make sure you discuss any questions you have with your health care provider.   Document Released: 01/03/2009 Document Revised: 10/06/2014 Document Reviewed: 06/17/2014 Elsevier Interactive Patient Education 2016 Elsevier Inc. Community-Acquired Pneumonia, Adult Pneumonia is an infection of the lungs. One type of pneumonia can happen while a person is in a hospital. A different type can happen when a person is not in a hospital (community-acquired pneumonia). It is easy for this kind to spread from person to person. It can spread to you if you breathe near an infected person who coughs or sneezes. Some symptoms include:  A dry cough.  A wet (productive) cough.  Fever.  Sweating.  Chest pain. HOME CARE  Take over-the-counter and prescription medicines only as told by your doctor.  Only take cough medicine if you are losing sleep.  If you were prescribed an antibiotic medicine, take it as told by your doctor. Do not stop taking the antibiotic even if you start to feel better.  Sleep with your head and neck raised (elevated). You can do this by putting a few pillows under your head, or you can sleep in a recliner.  Do not use tobacco products. These include  cigarettes, chewing tobacco, and e-cigarettes. If you need help quitting, ask your doctor.  Drink enough water to keep your pee (urine) clear or pale yellow. A shot (vaccine) can help prevent pneumonia. Shots are often suggested for:  People older than 72 years of age.  People older than 72 years of age:  Who are having cancer treatment.  Who have long-term (chronic) lung disease.  Who have problems with their body's defense system (immune system). You may also prevent pneumonia if you take these actions:  Get the flu (influenza) shot every year.  Go to the dentist as often as told.  Wash your hands often. If soap and water are not available, use hand sanitizer. GET HELP IF:  You have a fever.  You lose sleep because your cough medicine does not help. GET HELP RIGHT AWAY IF:  You are short of breath and it gets worse.  You have more chest pain.  Your sickness gets worse. This is very serious if:  You are an older adult.  Your body's defense system is weak.  You cough up blood.   This information is not intended to replace advice given to you by your health care provider. Make sure you discuss any questions you have with your health care provider.  Document Released: 07/04/2007 Document Revised: 10/06/2014 Document Reviewed: 05/12/2014 Elsevier Interactive Patient Education [employer]2016 Elsevier Inc.

## 2015-07-12 ENCOUNTER — Telehealth: Payer: Self-pay | Admitting: Pediatrics

## 2015-07-12 NOTE — Telephone Encounter (Signed)
She [place] be leaving for [name] next Wednesday but she has pneumonia. She had to go to the hospital and was prescribed Azithromyacin and Prednisone 20mg  twice daily. She has 2 days left of each medication. She wants to know if she needs to come in and see Dr. Beaulah DinningBardelas before her trip to make sure that she will be clear to go. She still has a bad cough. She wants to make sure that she has any medications that she may need to take with her.

## 2015-07-12 NOTE — Telephone Encounter (Signed)
Spoke with patient and schedule appointment in [facility] office for Monday June 19 before she leaves. Patient agreed with plan to follow up with Dr. Beaulah DinningBardelas before leaving the country.

## 2015-07-18 ENCOUNTER — Encounter: Payer: Self-pay | Admitting: Pediatrics

## 2015-07-18 ENCOUNTER — Ambulatory Visit (INDEPENDENT_AMBULATORY_CARE_PROVIDER_SITE_OTHER): Payer: Medicare Other | Admitting: Pediatrics

## 2015-07-18 VITALS — BP 126/62 | HR 70 | Temp 97.6°F | Resp 16

## 2015-07-18 DIAGNOSIS — H409 Unspecified glaucoma: Secondary | ICD-10-CM | POA: Diagnosis not present

## 2015-07-18 DIAGNOSIS — J4531 Mild persistent asthma with (acute) exacerbation: Secondary | ICD-10-CM

## 2015-07-18 DIAGNOSIS — J45901 Unspecified asthma with (acute) exacerbation: Secondary | ICD-10-CM | POA: Insufficient documentation

## 2015-07-18 MED ORDER — ALBUTEROL SULFATE (2.5 MG/3ML) 0.083% IN NEBU: 2.5000 mg | INHALATION_SOLUTION | RESPIRATORY_TRACT | Status: DC | PRN

## 2015-07-18 MED ORDER — ALBUTEROL SULFATE HFA 108 (90 BASE) MCG/ACT IN AERS: 2.0000 | INHALATION_SPRAY | RESPIRATORY_TRACT | Status: DC | PRN

## 2015-07-18 MED ORDER — BECLOMETHASONE DIPROPIONATE 80 MCG/ACT IN AERS
INHALATION_SPRAY | RESPIRATORY_TRACT | Status: DC
Start: 2015-07-18 — End: 2015-07-19

## 2015-07-18 NOTE — Progress Notes (Signed)
[address] [place] [id number] Dept: [phone]  FOLLOW UP NOTE  Patient ID: Kimberly Garza, female    DOB: 05/28/1945  Age: 70 y.o. MRN: [id number] Date of Office Visit: 07/18/2015  Assessment Chief Complaint: Asthma  HPI Kimberly Garza presents for follow-up of asthma. Her asthma had been very well controlled since her last office visit in 2015 and she did not have to take any medications. She was [facility] in [facility] emergency [facility] 11 and was given Zithromax and prednisone for 5 days. She has continued to have a nighttime cough and shortness of breath. Prior to this emergency room visit,  she was exposed to Clorox fumes and that precipitated the shortness of breath. She has glaucoma  Current medications are Proventil 2 puffs every 4 hours if needed. Her other medications are outlined in the chart.   Drug Allergies:  Allergies  Allergen Reactions  . Penicillins Hives and Itching    Has patient had a PCN reaction causing immediate rash, facial/tongue/throat swelling, SOB or lightheadedness with hypotension: YES Has patient had a PCN reaction causing severe rash involving mucus membranes or skin necrosis: NO  Has patient had a PCN reaction that required hospitalization: NO Has patient had a PCN reaction occurring within the last 10 years: NO If all of the above answers are "NO", then may proceed with Cephalosporin use.   Bar[employer] Setters. Levaquin [Levofloxacin In D5w] Itching    Physical Exam: BP 126/62 mmHg  Pulse 70  Temp(Src) 97.6 F (36.4 C) (Oral)  Resp 16   Physical Exam  Constitutional: She is oriented to person, place, and time. She appears well-developed and well-nourished.  HENT:  Eyes normal. Ears normal. Nose normal. Pharynx normal.  Neck: Neck supple.  Cardiovascular:  S1 and S2 normal no murmurs  Pulmonary/Chest:  Clear to percussion auscultation except for some rhonchi and wheezing in both lungs  Lymphadenopathy:    She has no cervical  adenopathy.  Neurological: She is alert and oriented to person, place, and time.  Skin:  Clear  Psychiatric: She has a normal mood and affect. Her behavior is normal. Judgment and thought content normal.  Vitals reviewed.   Diagnostics:  FVC 2.40 L FEV1 1.87 L. Predicted FVC 2.64 L predicted FEV1 2.05 L. After albuterol by nebulization FVC 2.43 L FEV1 1.90 L-the spirometry is in the normal range and there was no significant improvement after albuterol but her breath sounds improved  Assessment and Plan: 1. Asthma with acute exacerbation, mild persistent   2. Glaucoma     Meds ordered this encounter  Medications  . albuterol (PROVENTIL) (2.5 MG/3ML) 0.083% nebulizer solution    Sig: Take 3 mLs (2.5 mg total) by nebulization every 4 (four) hours as needed for wheezing or shortness of breath.    Dispense:  75 mL    Refill:  1  . albuterol (PROVENTIL HFA;VENTOLIN HFA) 108 (90 Base) MCG/ACT inhaler    Sig: Inhale 2 puffs into the lungs every 4 (four) hours as needed for wheezing or shortness of breath.    Dispense:  8 g    Refill:  1  . beclomethasone (QVAR) 80 MCG/ACT inhaler    Sig: ONE PUFF TWICE A DAY TO PREVENT COUGH OR WHEEZE. RINSE, GARGLE AND SPIT AFTER USE.    Dispense:  8.7 g    Refill:  5    Patient Instructions  Proventil 2 puffs every 4 hours if needed for wheezing or coughing spells or  instead albuterol 0.083% one unit dose every 4 hours Qvar 80-one puff twice a day to prevent coughing or wheezing Add prednisone 20 mg twice a day for 3 days, 20 mg on the fourth day, 10 mg on the fifth day  Follow-up iin one month If you indeed had pneumonia on June 11, will will repeat the chest x-ray then  to make sure that the pneumonia has cleared up Call us if you are not doing well on this treatment plan    Return in about 4 weeks (around 08/15/2015).    Thank you for the opportunity to care for this patient.  Please do not hesitate to contact me with questions.  Tonette Bihari, M.D.  [facility] [address] [place], [place] [address] [phone]

## 2015-07-18 NOTE — Patient Instructions (Addendum)
Proventil 2 puffs every 4 hours if needed for wheezing or coughing spells or instead albuterol 0.083% one unit dose every 4 hours Qvar 80-one puff twice a day to prevent coughing or wheezing Add prednisone 20 mg twice a day for 3 days, 20 mg on the fourth day, 10 mg on the fifth day  Follow-up iin one month If you indeed had pneumonia on June 11, will will repeat the chest x-ray then  to make sure that the pneumonia has cleared up Call us if you are not doing well on this treatment plan

## 2015-07-19 ENCOUNTER — Telehealth: Payer: Self-pay | Admitting: Pediatrics

## 2015-07-19 MED ORDER — FLUTICASONE PROPIONATE HFA 110 MCG/ACT IN AERO: INHALATION_SPRAY | RESPIRATORY_TRACT | Status: AC

## 2015-07-19 NOTE — Telephone Encounter (Signed)
PER [facility], PHARMACIST STATES FLOVENT IS PREFERRED FOR [employer]. PER DR BARDELAS, CALL IN FLOVEN 110 MCG, ONE PUFF TWICE A DAY.  SENT INTO PHARMACY. PT NOTIFIED.

## 2015-07-19 NOTE — Telephone Encounter (Signed)
[employer] [facility] will not cover Qvar.  She needs something else equivalent to it that [facility].  [facility] [place] [facility] on file.

## 2015-09-16 ENCOUNTER — Telehealth: Payer: Self-pay | Admitting: *Deleted

## 2015-09-16 NOTE — Telephone Encounter (Signed)
Will set up [facility] & file

## 2015-09-16 NOTE — Telephone Encounter (Signed)
PT IS SAYING THAT SHE RECEIVED A BILL IN THE MAIL. PT IS WONDERING WHY IT WAS BILLED TO [facility]AD OF THE [facility]. WOULD LIKE A RETURN CALL.

## 2015-11-18 ENCOUNTER — Other Ambulatory Visit: Payer: Self-pay | Admitting: Family Medicine

## 2015-11-18 DIAGNOSIS — Z1231 Encounter for screening mammogram for malignant neoplasm of breast: Secondary | ICD-10-CM

## 2015-12-06 ENCOUNTER — Ambulatory Visit: Payer: Medicare Other

## 2015-12-26 ENCOUNTER — Ambulatory Visit
Admission: RE | Admit: 2015-12-26 | Discharge: 2015-12-26 | Disposition: A | Discharge status: Home / Self Care | Payer: Medicare Other | Source: Ambulatory Visit | Attending: Family Medicine | Admitting: Family Medicine

## 2015-12-26 DIAGNOSIS — Z1231 Encounter for screening mammogram for malignant neoplasm of breast: Secondary | ICD-10-CM

## 2016-03-28 ENCOUNTER — Telehealth: Payer: Self-pay | Admitting: Pediatrics

## 2016-03-28 NOTE — Telephone Encounter (Signed)
Patient has called requesting that her previod bills form 01/2013 to the present be sent to her.  Patient has been approved for [employer] and they are willing to reimburse the patient for any our of pocket expense. Patient would like to know what was billed to Medicare and what they paid and then what was left over for the patient to pay Please call the patient is regards to this request - she has some alternate questions about certain codes and some information that needs to be on the information as far as the physician name, tad ID, date of service, ect.

## 2016-03-28 NOTE — Telephone Encounter (Signed)
No answer - mailbox is full - will try again tomorrow °

## 2016-03-29 NOTE — Telephone Encounter (Signed)
Still no answer & mailbox is still full - will try again tomorrow

## 2016-03-30 NOTE — Telephone Encounter (Signed)
No answer/mailbox is still full

## 2016-04-02 NOTE — Telephone Encounter (Signed)
No answer & mailbox is still full - will just send printouts in the mail with information stated below

## 2016-08-27 ENCOUNTER — Encounter (HOSPITAL_BASED_OUTPATIENT_CLINIC_OR_DEPARTMENT_OTHER): Payer: Self-pay | Admitting: Emergency Medicine

## 2016-08-27 ENCOUNTER — Emergency Department (HOSPITAL_BASED_OUTPATIENT_CLINIC_OR_DEPARTMENT_OTHER)
Admission: EM | Admit: 2016-08-27 | Discharge: 2016-08-27 | Disposition: A | Discharge status: Home / Self Care | Payer: Medicare Other | Attending: Emergency Medicine | Admitting: Emergency Medicine

## 2016-08-27 DIAGNOSIS — J45909 Unspecified asthma, uncomplicated: Secondary | ICD-10-CM | POA: Insufficient documentation

## 2016-08-27 DIAGNOSIS — Z79899 Other long term (current) drug therapy: Secondary | ICD-10-CM | POA: Insufficient documentation

## 2016-08-27 DIAGNOSIS — M545 Low back pain, unspecified: Secondary | ICD-10-CM

## 2016-08-27 DIAGNOSIS — Z87891 Personal history of nicotine dependence: Secondary | ICD-10-CM | POA: Diagnosis not present

## 2016-08-27 MED ORDER — METHOCARBAMOL 500 MG PO TABS: 500.0000 mg | ORAL_TABLET | Freq: Two times a day (BID) | ORAL | 0 refills | Status: DC

## 2016-08-27 MED ORDER — NAPROXEN 500 MG PO TABS: 500.0000 mg | ORAL_TABLET | Freq: Two times a day (BID) | ORAL | 0 refills | Status: DC

## 2016-08-27 MED ORDER — TIZANIDINE HCL 4 MG PO TABS: 2.0000 mg | ORAL_TABLET | Freq: Three times a day (TID) | ORAL | 0 refills | Status: DC

## 2016-08-27 MED ORDER — NAPROXEN 250 MG PO TABS
500.0000 mg | ORAL_TABLET | Freq: Once | ORAL | Status: AC
  Administered 2016-08-27: 500 mg via ORAL
  Filled 2016-08-27: qty 2

## 2016-08-27 MED FILL — NAPROXEN 500 MG TABLET: 500 | 15 days supply | Qty: 30 | Fill #0

## 2016-08-27 MED FILL — tiZANidine HCL 2 MG TABS: 2 | 7 days supply | Qty: 20 | Fill #0

## 2016-08-27 NOTE — ED Triage Notes (Signed)
Pt has some back issues since 2004.  Recently pt slipped and caused increase in pain.  Pt states lower back pain radiating to right leg and partial left.  No difficulty with urination.  No weakness.

## 2016-08-27 NOTE — ED Provider Notes (Signed)
Medical screening examination/treatment/procedure(s) were conducted as a shared visit with non-physician practitioner(s) and myself.  I personally evaluated the patient during the encounter. Briefly, she is a 71 y.o. female who presents with back pain in lumbar area for 2 weeks with signs of radicular pain in the setting of heavy lifting. No acute traumatic onset. No red flag symptoms of fever, weight loss, saddle anesthesia, weakness, fecal/urinary incontinence or urinary retention.   Suspect MSK etiology. No indication for imaging emergently. Patient was recommended to take short course of scheduled NSAIDs and engage in early mobility as definitive treatment. Return precautions discussed for worsening or new concerning symptoms.   The patient is safe for discharge with strict return precautions.     Nira Connardama, Josean Lycan Eduardo, MD 08/27/16 [phone]

## 2016-08-27 NOTE — ED Provider Notes (Signed)
[facility] Provider Note   CSN: [id number] Arrival date & time: 08/27/16  0848     History   Chief Complaint Chief Complaint  Patient presents with  . Back Pain    HPI Kimberly Garza PaliJoan Garza is a 71 y.o. female.  HPI   Patient is a 71 year old female with history of asthma, hyperlipidemia and cervical spine discectomy who presents to the ED with complaint of low back pain. Patient states a few weeks ago she slipped while walking and caught herself prior to falling. She states since then she has been having gradually worsening pain to her lower back. She reports having chronic low back pain but notes her pain has been worse since slipping. She states the pain will radiate down her right leg and intermittently down her left leg. She notes her pain is worse after laying supine for a prolonged period of time. She states she has been taking ibuprofen and family members naproxen at home with intermittent relief. She notes she has continued to remain active over the past few weeks and reports lifting a heavy flowerpot a few days ago while trying to get ready for her family member's wedding. Denies any other recent fall or injury. She reports having intermittent muscle spasms to her right leg. Pt denies fever, numbness, tingling, saddle anesthesia, loss of bowel or bladder, weakness, CP, SOB, abdominal pain, vomiting, urinary sxs, flank pain, IVDU, cancer or recent spinal manipulation. She states she has been able to ambulate but has had to use a walking stick due to her pain.  Past Medical History:  Diagnosis Date  . Asthma   . Glaucoma   . High cholesterol     Patient Active Problem List   Diagnosis Date Noted  . Asthma with acute exacerbation 07/18/2015  . Glaucoma 07/18/2015    Past Surgical History:  Procedure Laterality Date  . CERVICAL DISCECTOMY    . ECTOPIC PREGNANCY SURGERY      OB History    No data available       Home Medications    Prior to Admission  medications   Medication Sig Start Date End Date Taking? Authorizing Provider  albuterol (PROVENTIL HFA;VENTOLIN HFA) 108 (90 Base) MCG/ACT inhaler Inhale 2 puffs into the lungs every 4 (four) hours as needed for wheezing or shortness of breath. 07/18/15   Fletcher AnonBardelas, Jose A, MD  albuterol (PROVENTIL) (2.5 MG/3ML) 0.083% nebulizer solution Take 3 mLs (2.5 mg total) by nebulization every 4 (four) hours as needed for wheezing or shortness of breath. 07/18/15   Fletcher AnonBardelas, Jose A, MD  bimatoprost (LUMIGAN) 0.03 % ophthalmic solution Place 1 drop into both eyes at bedtime.    [provider]  BLACK CURRANT SEED OIL PO Take 1 tablet by mouth daily.    [provider]  brimonidine (ALPHAGAN) 0.15 % ophthalmic solution Place 1 drop into the right eye 2 (two) times daily.    [provider]  brinzolamide (AZOPT) 1 % ophthalmic suspension Place 1 drop into both eyes 2 (two) times daily.    [provider]  Cholecalciferol (VITAMIN D-1000 MAX ST) 1000 units tablet Take 1,000 Units by mouth daily.    [provider]  fluticasone (FLOVENT HFA) 110 MCG/ACT inhaler ONE PUFF TWICE A DAY TO PREVENT COUGH OR WHEEZE. RINSE, GARGLE AND SPIT AFTER USEL 07/19/15   Fletcher AnonBardelas, Jose A, MD  methocarbamol (ROBAXIN) 500 MG tablet Take 1 tablet (500 mg total) by mouth 2 (two) times daily. 08/27/16  Barrett HenleNadeau, Kayton Ripp Elizabeth, PA-C  naproxen (NAPROSYN) 500 MG tablet Take 1 tablet (500 mg total) by mouth 2 (two) times daily. 08/27/16   Barrett HenleNadeau, Antonia Culbertson Elizabeth, PA-C  vitamin E 400 UNIT capsule Take 400 Units by mouth daily.    [provider]    Family History No family history on file.  Social History Social History  Substance Use Topics  . Smoking status: Former Smoker    Packs/day: 0.10    Types: Cigarettes    Start date: 08/21/1963    Quit date: 08/20/1968  . Smokeless tobacco: Never Used  . Alcohol use No     Allergies   Penicillins and Levaquin [levofloxacin in  d5w]   Review of Systems Review of Systems  Musculoskeletal: Positive for back pain and myalgias.  All other systems reviewed and are negative.    Physical Exam Updated Vital Signs BP (!) 160/73 (BP Location: Right Arm)   Pulse 68   Temp 97.6 F (36.4 C) (Oral)   Resp 18   Ht 5' 6.5" (1.689 m)   Wt 72.6 kg (160 lb)   SpO2 100%   BMI 25.44 kg/m   Physical Exam  Constitutional: She is oriented to person, place, and time. She appears well-developed and well-nourished. No distress.  HENT:  Head: Normocephalic and atraumatic.  Mouth/Throat: Oropharynx is clear and moist. No oropharyngeal exudate.  Eyes: Conjunctivae and EOM are normal. Right eye exhibits no discharge. Left eye exhibits no discharge. No scleral icterus.  Neck: Normal range of motion. Neck supple.  Cardiovascular: Normal rate, regular rhythm, normal heart sounds and intact distal pulses.   Pulmonary/Chest: Effort normal and breath sounds normal. No respiratory distress. She has no wheezes. She has no rales. She exhibits no tenderness.  Abdominal: Soft. Bowel sounds are normal. She exhibits no distension and no mass. There is no tenderness. There is no rebound and no guarding. No hernia.  Musculoskeletal: Normal range of motion. She exhibits tenderness. She exhibits no edema or deformity.  No midline C, T, or L tenderness. Mild TTP over bilateral lumbar paraspinal muscles. Negative straight leg raise bilaterally. Full range of motion of neck and back. Full range of motion of bilateral upper and lower extremities, with 5/5 strength. Sensation intact. 2+ radial and PT pulses. Cap refill <2 seconds. Patient able to stand and ambulate without assistance but endorses back pain.    Neurological: She is alert and oriented to person, place, and time. She has normal strength. She displays normal reflexes. No sensory deficit.  Skin: Skin is warm and dry. She is not diaphoretic.  Nursing note and vitals reviewed.    ED  Treatments / Results  Labs (all labs ordered are listed, but only abnormal results are displayed) Labs Reviewed - No data to display  EKG  EKG Interpretation None       Radiology No results found.  Procedures Procedures (including critical care time)  Medications Ordered in ED Medications  naproxen (NAPROSYN) tablet 500 mg (500 mg Oral Given 08/27/16 [id number])     Initial Impression / Assessment and Plan / ED Course  I have reviewed the triage vital signs and the nursing notes.  Pertinent labs & imaging results that were available during my care of the patient were reviewed by me and considered in my medical decision making (see chart for details).     Patient with low back pain that started after slipping a few weeks ago. VSS. Exam showed mild TTP over bilateral lumbar paraspinal muscles.  No neurological deficits and normal neuro exam.  Patient can walk but states is painful.  No loss of bowel or bladder control.  No concern for cauda equina.  No fever, night sweats, weight loss, h/o cancer, IVDU. No midline spinal tenderness. Suspect sxs are likely due to muscle strain/spasms related to recent injury and continued use over the past few weeks. Plan to d/c home with RICE protocol, heat therapy, NSAIDs, muscle relaxant. Advised to f/u with PCP as needed.  Discussed return precautions.   Final Clinical Impressions(s) / ED Diagnoses   Final diagnoses:  Acute bilateral low back pain without sciatica    New Prescriptions New Prescriptions   METHOCARBAMOL (ROBAXIN) 500 MG TABLET    Take 1 tablet (500 mg total) by mouth 2 (two) times daily.   NAPROXEN (NAPROSYN) 500 MG TABLET    Take 1 tablet (500 mg total) by mouth 2 (two) times daily.     Barrett Henle, [place] 08/27/16 [phone]

## 2016-08-27 NOTE — Discharge Instructions (Signed)
Take your medications as prescribed. You may also apply ice and/or heat to affected area for 15-20 minutes 3-4 times daily for additional pain relief. I recommend refraining from doing any heavy lifting, squatting or repetitive movements that exacerbate her symptoms for the next few days. °Follow-up with your primary care provider in the next week if her symptoms have not improved. °Please return to the Emergency Department if symptoms worsen or new onset of denies fever, numbness, tingling, groin anesthesia, loss of bowel or bladder, weakness, chest pain, abdominal pain, vomiting. °

## 2016-10-01 ENCOUNTER — Encounter (HOSPITAL_COMMUNITY): Payer: Self-pay | Admitting: Emergency Medicine

## 2016-10-01 ENCOUNTER — Emergency Department (HOSPITAL_COMMUNITY): Payer: Medicare Other

## 2016-10-01 ENCOUNTER — Emergency Department (HOSPITAL_COMMUNITY)
Admission: EM | Admit: 2016-10-01 | Discharge: 2016-10-01 | Disposition: A | Discharge status: Home / Self Care | Payer: Medicare Other | Attending: Emergency Medicine | Admitting: Emergency Medicine

## 2016-10-01 DIAGNOSIS — M545 Low back pain: Secondary | ICD-10-CM | POA: Diagnosis present

## 2016-10-01 DIAGNOSIS — Z87891 Personal history of nicotine dependence: Secondary | ICD-10-CM | POA: Diagnosis not present

## 2016-10-01 DIAGNOSIS — Z79899 Other long term (current) drug therapy: Secondary | ICD-10-CM | POA: Insufficient documentation

## 2016-10-01 DIAGNOSIS — J45909 Unspecified asthma, uncomplicated: Secondary | ICD-10-CM | POA: Diagnosis not present

## 2016-10-01 DIAGNOSIS — M5440 Lumbago with sciatica, unspecified side: Secondary | ICD-10-CM | POA: Diagnosis not present

## 2016-10-01 MED ORDER — OXYCODONE-ACETAMINOPHEN 5-325 MG PO TABS
2.0000 | ORAL_TABLET | Freq: Once | ORAL | Status: AC
  Administered 2016-10-01: 2 via ORAL
  Filled 2016-10-01: qty 2

## 2016-10-01 MED ORDER — METHOCARBAMOL 500 MG PO TABS: 500.0000 mg | ORAL_TABLET | Freq: Two times a day (BID) | ORAL | 0 refills | Status: DC

## 2016-10-01 MED ORDER — OXYCODONE-ACETAMINOPHEN 5-325 MG PO TABS: 2.0000 | ORAL_TABLET | ORAL | 0 refills | Status: DC | PRN

## 2016-10-01 MED ORDER — DIAZEPAM 5 MG PO TABS
5.0000 mg | ORAL_TABLET | Freq: Once | ORAL | Status: AC
  Administered 2016-10-01: 5 mg via ORAL
  Filled 2016-10-01: qty 1

## 2016-10-01 MED ORDER — IBUPROFEN 200 MG PO TABS
400.0000 mg | ORAL_TABLET | Freq: Once | ORAL | Status: DC
  Filled 2016-10-01: qty 2

## 2016-10-01 MED ORDER — MORPHINE SULFATE (PF) 4 MG/ML IV SOLN: 4.0000 mg | Freq: Once | INTRAVENOUS | Status: DC

## 2016-10-01 NOTE — ED Triage Notes (Signed)
Pt c/o recurrent low back pain radiating down the r/leg. Pt tx for similar concern 07/2016. Pt reported that she was pulling weeds 2 days ago, Pain low back has increased over the last 24 hours

## 2016-10-01 NOTE — ED Provider Notes (Signed)
[facility]PT Provider Note   CSN: [id number] Arrival date & time: 10/01/16  1137     History   Chief Complaint Chief Complaint  Patient presents with  . Back Pain    HPI Kimberly Garza is a 71 y.o. female.  71 year old recurrent lower lumbar spine pain. Patient injured her lower back several weeks ago which is the differential. Was seen at the [facility] and treated with Flexeril and Naprosyn and felt better. 2 days ago she was pulling weeds in her chart and bent over and had return of her symptoms. No bowel or bladder dysfunction. Pain once again characterized as sharp with radiation to her legs. No perineal numbness. Pain better with remaining still and she tried Tylenol without relief. Denies any foot drop when she walks      Past Medical History:  Diagnosis Date  . Asthma   . Glaucoma   . High cholesterol     Patient Active Problem List   Diagnosis Date Noted  . Asthma with acute exacerbation 07/18/2015  . Glaucoma 07/18/2015    Past Surgical History:  Procedure Laterality Date  . CERVICAL DISCECTOMY    . DENTAL SURGERY    . ECTOPIC PREGNANCY SURGERY    . TONSILLECTOMY      OB History    No data available       Home Medications    Prior to Admission medications   Medication Sig Start Date End Date Taking? Authorizing Provider  albuterol (PROVENTIL HFA;VENTOLIN HFA) 108 (90 Base) MCG/ACT inhaler Inhale 2 puffs into the lungs every 4 (four) hours as needed for wheezing or shortness of breath. 07/18/15   Fletcher AnonBardelas, Jose A, MD  albuterol (PROVENTIL) (2.5 MG/3ML) 0.083% nebulizer solution Take 3 mLs (2.5 mg total) by nebulization every 4 (four) hours as needed for wheezing or shortness of breath. 07/18/15   Fletcher AnonBardelas, Jose A, MD  bimatoprost (LUMIGAN) 0.03 % ophthalmic solution Place 1 drop into both eyes at bedtime.    [provider]  BLACK CURRANT SEED OIL PO Take 1 tablet by mouth daily.    [provider]  brimonidine  (ALPHAGAN) 0.15 % ophthalmic solution Place 1 drop into the right eye 2 (two) times daily.    [provider]  brinzolamide (AZOPT) 1 % ophthalmic suspension Place 1 drop into both eyes 2 (two) times daily.    [provider]  Cholecalciferol (VITAMIN D-1000 MAX ST) 1000 units tablet Take 1,000 Units by mouth daily.    [provider]  fluticasone (FLOVENT HFA) 110 MCG/ACT inhaler ONE PUFF TWICE A DAY TO PREVENT COUGH OR WHEEZE. RINSE, GARGLE AND SPIT AFTER USEL 07/19/15   Fletcher AnonBardelas, Jose A, MD  naproxen (NAPROSYN) 500 MG tablet Take 1 tablet (500 mg total) by mouth 2 (two) times daily. 08/27/16   Barrett HenleNadeau, Nicole Elizabeth, PA-C  tiZANidine (ZANAFLEX) 4 MG tablet Take 0.5 tablets (2 mg total) by mouth 3 (three) times daily. 08/27/16   Barrett HenleNadeau, Nicole Elizabeth, PA-C  vitamin E 400 UNIT capsule Take 400 Units by mouth daily.    [provider]    Family History Family History  Problem Relation Age of Onset  . Heart failure Father     Social History Social History  Substance Use Topics  . Smoking status: Former Smoker    Packs/day: 0.10    Types: Cigarettes    Start date: 08/21/1963    Quit date: 08/20/1968  . Smokeless tobacco: Never Used  . Alcohol use No  Allergies   Penicillins and Levaquin [levofloxacin in d5w]   Review of Systems Review of Systems  All other systems reviewed and are negative.    Physical Exam Updated Vital Signs BP (!) 168/82 (BP Location: Right Arm)   Pulse (!) 57   Temp 97.8 F (36.6 C) (Oral)   Resp 18   SpO2 100%   Physical Exam  Constitutional: She is oriented to person, place, and time. She appears well-developed and well-nourished.  Non-toxic appearance. No distress.  HENT:  Head: Normocephalic and atraumatic.  Eyes: Pupils are equal, round, and reactive to light. Conjunctivae, EOM and lids are normal.  Neck: Normal range of motion. Neck supple. No tracheal deviation present. No thyroid mass present.    Cardiovascular: Normal rate, regular rhythm and normal heart sounds.  Exam reveals no gallop.   No murmur heard. Pulmonary/Chest: Effort normal and breath sounds normal. No stridor. No respiratory distress. She has no decreased breath sounds. She has no wheezes. She has no rhonchi. She has no rales.  Abdominal: Soft. Normal appearance and bowel sounds are normal. She exhibits no distension. There is no tenderness. There is no rebound and no CVA tenderness.  Musculoskeletal: Normal range of motion. She exhibits no edema.       Lumbar back: She exhibits tenderness.       Back:  Neurological: She is alert and oriented to person, place, and time. She has normal strength. No cranial nerve deficit or sensory deficit. GCS eye subscore is 4. GCS verbal subscore is 5. GCS motor subscore is 6.  Skin: Skin is warm and dry. No abrasion and no rash noted.  Psychiatric: She has a normal mood and affect. Her speech is normal and behavior is normal.  Nursing note and vitals reviewed.    ED Treatments / Results  Labs (all labs ordered are listed, but only abnormal results are displayed) Labs Reviewed - No data to display  EKG  EKG Interpretation None       Radiology No results found.  Procedures Procedures (including critical care time)  Medications Ordered in ED Medications  diazepam (VALIUM) tablet 5 mg (not administered)  ibuprofen (ADVIL,MOTRIN) tablet 400 mg (not administered)  oxyCODONE-acetaminophen (PERCOCET/ROXICET) 5-325 MG per tablet 2 tablet (not administered)     Initial Impression / Assessment and Plan / ED Course  I have reviewed the triage vital signs and the nursing notes.  Pertinent labs & imaging results that were available during my care of the patient were reviewed by me and considered in my medical decision making (see chart for details).   patient with negative x-rays and felt better after receiving pain meds here. Stable for discharge to home    Final  Clinical Impressions(s) / ED Diagnoses   Final diagnoses:  None    New Prescriptions New Prescriptions   No medications on file     Lorre Nick, MD 10/01/16 1540

## 2016-10-01 NOTE — ED Notes (Signed)
Patient transported to X-ray 

## 2016-11-08 ENCOUNTER — Other Ambulatory Visit: Payer: Self-pay | Admitting: Family Medicine

## 2016-11-08 DIAGNOSIS — Z1231 Encounter for screening mammogram for malignant neoplasm of breast: Secondary | ICD-10-CM

## 2016-12-26 ENCOUNTER — Ambulatory Visit
Admission: RE | Admit: 2016-12-26 | Discharge: 2016-12-26 | Disposition: A | Discharge status: Home / Self Care | Payer: Medicare Other | Source: Ambulatory Visit | Attending: Family Medicine | Admitting: Family Medicine

## 2016-12-26 DIAGNOSIS — Z1231 Encounter for screening mammogram for malignant neoplasm of breast: Secondary | ICD-10-CM

## 2017-11-21 ENCOUNTER — Other Ambulatory Visit: Payer: Self-pay | Admitting: Family Medicine

## 2017-11-21 DIAGNOSIS — Z1231 Encounter for screening mammogram for malignant neoplasm of breast: Secondary | ICD-10-CM

## 2018-01-03 ENCOUNTER — Ambulatory Visit
Admission: RE | Admit: 2018-01-03 | Discharge: 2018-01-03 | Disposition: A | Discharge status: Home / Self Care | Payer: Medicare Other | Source: Ambulatory Visit | Attending: Family Medicine | Admitting: Family Medicine

## 2018-01-03 DIAGNOSIS — Z1231 Encounter for screening mammogram for malignant neoplasm of breast: Secondary | ICD-10-CM

## 2018-02-08 ENCOUNTER — Ambulatory Visit (HOSPITAL_COMMUNITY)
Admission: EM | Admit: 2018-02-08 | Discharge: 2018-02-08 | Disposition: A | Discharge status: Home / Self Care | Payer: Medicare Other

## 2018-02-08 NOTE — ED Notes (Signed)
Pt scheduled an appointment for tomorrow 

## 2018-02-09 ENCOUNTER — Ambulatory Visit (HOSPITAL_COMMUNITY)
Admission: EM | Admit: 2018-02-09 | Discharge: 2018-02-09 | Disposition: A | Discharge status: Home / Self Care | Payer: Medicare Other

## 2018-02-09 ENCOUNTER — Other Ambulatory Visit: Payer: Self-pay

## 2018-02-09 ENCOUNTER — Encounter (HOSPITAL_COMMUNITY): Payer: Self-pay | Admitting: *Deleted

## 2018-02-09 DIAGNOSIS — R05 Cough: Secondary | ICD-10-CM | POA: Diagnosis not present

## 2018-02-09 DIAGNOSIS — J069 Acute upper respiratory infection, unspecified: Secondary | ICD-10-CM | POA: Diagnosis not present

## 2018-02-09 DIAGNOSIS — B9789 Other viral agents as the cause of diseases classified elsewhere: Secondary | ICD-10-CM | POA: Diagnosis not present

## 2018-02-09 DIAGNOSIS — R062 Wheezing: Secondary | ICD-10-CM | POA: Diagnosis not present

## 2018-02-09 MED ORDER — METHYLPREDNISOLONE SODIUM SUCC 125 MG IJ SOLR
INTRAMUSCULAR | Status: AC
  Filled 2018-02-09: qty 2

## 2018-02-09 MED ORDER — ALBUTEROL SULFATE HFA 108 (90 BASE) MCG/ACT IN AERS: 2.0000 | INHALATION_SPRAY | RESPIRATORY_TRACT | 1 refills | Status: AC | PRN

## 2018-02-09 MED ORDER — METHYLPREDNISOLONE SODIUM SUCC 125 MG IJ SOLR
80.0000 mg | Freq: Once | INTRAMUSCULAR | Status: AC
  Administered 2018-02-09: 80 mg via INTRAMUSCULAR

## 2018-02-09 NOTE — Discharge Instructions (Addendum)
We will give you a steroid injection here in clinic to help with some of the wheezing and lung inflammation.  Albuterol inhaler refilled and sent to the pharmacy for cough, wheezing, shortness of breath. You can do the nebulizer treatments as you have been doing as needed. Continue the Zarbees if this is helping with your cough. Follow up as needed for continued or worsening symptoms

## 2018-02-09 NOTE — ED Provider Notes (Signed)
[facility]    CSN: [id number] Arrival date & time: 02/09/18  1136     History   Chief Complaint Chief Complaint  Patient presents with  . Appointment    12:00  . Cough    HPI Kimberly Garza is a 73 y.o. female.   Patient is a 73 year old female with past medical history of asthma.  She presents with approximately 4 to 5 days of cough, congestion, wheezing.  She had subjective fever when the symptoms first started.  That has since subsided.  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  She has been using nebulizer treatments at home for the wheezing.  She is out of her albuterol inhaler.  She is also been doing Zarbees for cough and congestion.  This seems to help her cough and help her rest at nighttime.  She denies any chest pain, shortness of breath.   ROS per HPI      Past Medical History:  Diagnosis Date  . Asthma   . Glaucoma   . High cholesterol     Patient Active Problem List   Diagnosis Date Noted  . Asthma with acute exacerbation 07/18/2015  . Glaucoma 07/18/2015    Past Surgical History:  Procedure Laterality Date  . CERVICAL DISCECTOMY    . DENTAL SURGERY    . ECTOPIC PREGNANCY SURGERY    . TONSILLECTOMY      OB History   No obstetric history on file.      Home Medications    Prior to Admission medications   Medication Sig Start Date End Date Taking? Authorizing Provider  bimatoprost (LUMIGAN) 0.03 % ophthalmic solution Place 1 drop into both eyes at bedtime.   Yes [provider]  brimonidine (ALPHAGAN) 0.15 % ophthalmic solution Place 1 drop into both eyes 3 (three) times daily.    Yes [provider]  brinzolamide (AZOPT) 1 % ophthalmic suspension Place 1 drop into both eyes 2 (two) times daily.   Yes [provider]  Cholecalciferol (VITAMIN D-1000 MAX ST) 1000 units tablet Take 1,000 Units by mouth daily.   Yes [provider]  ELDERBERRY PO Take by mouth.   Yes  [provider]  Flaxseed, Linseed, (FLAXSEED OIL PO) Take 1 tablet by mouth 2 (two) times daily.   Yes [provider]  acetaminophen (TYLENOL) 500 MG tablet Take 1,000 mg by mouth every 6 (six) hours as needed for mild pain, moderate pain, fever or headache.    [provider]  albuterol (PROVENTIL HFA;VENTOLIN HFA) 108 (90 Base) MCG/ACT inhaler Inhale 2 puffs into the lungs every 4 (four) hours as needed for wheezing or shortness of breath. 02/09/18   Perfecto Purdy, Gloris Manchester A, NP  fluticasone (FLOVENT HFA) 110 MCG/ACT inhaler ONE PUFF TWICE A DAY TO PREVENT COUGH OR WHEEZE. RINSE, GARGLE AND SPIT AFTER USEL 07/19/15   Fletcher Anon, MD  Omega-3 Fatty Acids (FISH OIL PO) Take 1 capsule by mouth daily.    [provider]    Family History Family History  Problem Relation Age of Onset  . Heart failure Father     Social History Social History   Tobacco Use  . Smoking status: Former Smoker    Packs/day: 0.10    Types: Cigarettes    Start date: 08/21/1963    Last attempt to quit: 08/20/1968    Years since quitting: 49.5  . Smokeless tobacco: Never Used  Substance Use Topics  .  Alcohol use: No  . Drug use: No     Allergies   Shrimp [shellfish allergy]; Penicillins; and Levaquin [levofloxacin in d5w]   Review of Systems Review of Systems   Physical Exam Triage Vital Signs ED Triage Vitals [02/09/18 1154]  Enc Vitals Group     BP (!) 145/73     Pulse Rate 68     Resp 16     Temp 97.8 F (36.6 C)     Temp Source Oral     SpO2 100 %     Weight      Height      Head Circumference      Peak Flow      Pain Score 0     Pain Loc      Pain Edu?      Excl. in GC?    No data found.  Updated Vital Signs BP (!) 145/73   Pulse 68   Temp 97.8 F (36.6 C) (Oral)   Resp 16   SpO2 100%   Visual Acuity Right Eye Distance:   Left Eye Distance:   Bilateral Distance:    Right Eye Near:   Left Eye Near:    Bilateral Near:     Physical  Exam Vitals signs and nursing note reviewed.  Constitutional:      General: She is not in acute distress.    Appearance: Normal appearance. She is not ill-appearing, toxic-appearing or diaphoretic.  HENT:     Head: Normocephalic and atraumatic.     Right Ear: Tympanic membrane, ear canal and external ear normal.     Left Ear: Tympanic membrane, ear canal and external ear normal.     Nose: Nose normal.     Mouth/Throat:     Pharynx: Oropharynx is clear.  Eyes:     Conjunctiva/sclera: Conjunctivae normal.  Neck:     Musculoskeletal: Normal range of motion.  Cardiovascular:     Rate and Rhythm: Normal rate and regular rhythm.     Pulses: Normal pulses.     Heart sounds: Normal heart sounds.  Pulmonary:     Effort: Pulmonary effort is normal.     Breath sounds: Normal breath sounds.  Musculoskeletal: Normal range of motion.  Lymphadenopathy:     Cervical: No cervical adenopathy.  Skin:    General: Skin is warm and dry.     Findings: No rash.  Neurological:     Mental Status: She is alert.  Psychiatric:        Mood and Affect: Mood normal.      UC Treatments / Results  Labs (all labs ordered are listed, but only abnormal results are displayed) Labs Reviewed - No data to display  EKG None  Radiology No results found.  Procedures Procedures (including critical care time)  Medications Ordered in UC Medications  methylPREDNISolone sodium succinate (SOLU-MEDROL) 125 mg/2 mL injection 80 mg (80 mg Intramuscular Given 02/09/18 1236)    Initial Impression / Assessment and Plan / UC Course  I have reviewed the triage vital signs and the nursing notes.  Pertinent labs & imaging results that were available during my care of the patient were reviewed by me and considered in my medical decision making (see chart for details).    Pt has hx of asthma exacerbation with upper respiratory infection. Similar symptoms during weather changes.  Viral URI with wheezing.  Will give  her a steroid injection here in the clinic to help with the wheezing and lung  inflammation.  Albuterol inhaler refilled.  She can continue the zarbees for cough since this is helping.  Follow up as needed for continued or worsening symptoms  Final Clinical Impressions(s) / UC Diagnoses   Final diagnoses:  Viral URI with cough     Discharge Instructions     We will give you a steroid injection here in clinic to help with some of the wheezing and lung inflammation.  Albuterol inhaler refilled and sent to the pharmacy for cough, wheezing, shortness of breath. You can do the nebulizer treatments as you have been doing as needed. Continue the Zarbees if this is helping with your cough. Follow up as needed for continued or worsening symptoms     ED Prescriptions    Medication Sig Dispense Auth. Provider   albuterol (PROVENTIL HFA;VENTOLIN HFA) 108 (90 Base) MCG/ACT inhaler Inhale 2 puffs into the lungs every 4 (four) hours as needed for wheezing or shortness of breath. 8 g Dahlia ByesBast, Kelyn Koskela A, NP     Controlled Substance Prescriptions [place] Controlled Substance Registry consulted? Not Applicable   Janace ArisBast, Keagen Heinlen A, NP 02/09/18 1239

## 2018-02-09 NOTE — ED Triage Notes (Signed)
C/O cough and congestion x few days; reports tactile fever 3 nights ago, which seems to have resolved.

## 2018-07-03 ENCOUNTER — Telehealth: Payer: Medicare Other | Admitting: Physician Assistant

## 2018-07-03 ENCOUNTER — Ambulatory Visit (HOSPITAL_COMMUNITY)
Admission: EM | Admit: 2018-07-03 | Discharge: 2018-07-03 | Disposition: A | Discharge status: Home / Self Care | Payer: Medicare Other | Attending: Urgent Care | Admitting: Urgent Care

## 2018-07-03 ENCOUNTER — Encounter (HOSPITAL_COMMUNITY): Payer: Self-pay | Admitting: Emergency Medicine

## 2018-07-03 ENCOUNTER — Other Ambulatory Visit: Payer: Self-pay

## 2018-07-03 DIAGNOSIS — R42 Dizziness and giddiness: Secondary | ICD-10-CM

## 2018-07-03 DIAGNOSIS — J3089 Other allergic rhinitis: Secondary | ICD-10-CM

## 2018-07-03 DIAGNOSIS — H6993 Unspecified Eustachian tube disorder, bilateral: Secondary | ICD-10-CM

## 2018-07-03 DIAGNOSIS — I10 Essential (primary) hypertension: Secondary | ICD-10-CM

## 2018-07-03 DIAGNOSIS — H6983 Other specified disorders of Eustachian tube, bilateral: Secondary | ICD-10-CM

## 2018-07-03 MED ORDER — AMLODIPINE BESYLATE 5 MG PO TABS: 5.0000 mg | ORAL_TABLET | Freq: Every day | ORAL | 0 refills | Status: DC

## 2018-07-03 MED ORDER — MECLIZINE HCL 12.5 MG PO TABS: 12.5000 mg | ORAL_TABLET | Freq: Three times a day (TID) | ORAL | 0 refills | Status: AC | PRN

## 2018-07-03 MED ORDER — MONTELUKAST SODIUM 10 MG PO TABS: 10.0000 mg | ORAL_TABLET | Freq: Every day | ORAL | 3 refills | Status: AC

## 2018-07-03 NOTE — ED Provider Notes (Signed)
MRN: [id number] DOB: 12/30/1945  Subjective:   Kimberly Garza is a 73 y.o. female presenting for 2 week history of intermittent recurrent vertigo, room spinning sensation. Symptoms started when she was laying down, continued as she got up. Had to lay down and resolved on its own after ~15 minutes. She had a recurrence today. 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 Denies history of HTN. Has a PCP. Also reports hx of OSA, needs to be on CPAP but does not use it consistently.   No current facility-administered medications for this encounter.   Current Outpatient Medications:  .  acetaminophen (TYLENOL) 500 MG tablet, Take 1,000 mg by mouth every 6 (six) hours as needed for mild pain, moderate pain, fever or headache., Disp: , Rfl:  .  albuterol (PROVENTIL HFA;VENTOLIN HFA) 108 (90 Base) MCG/ACT inhaler, Inhale 2 puffs into the lungs every 4 (four) hours as needed for wheezing or shortness of breath., Disp: 8 g, Rfl: 1 .  bimatoprost (LUMIGAN) 0.03 % ophthalmic solution, Place 1 drop into both eyes at bedtime., Disp: , Rfl:  .  brimonidine (ALPHAGAN) 0.15 % ophthalmic solution, Place 1 drop into both eyes 3 (three) times daily. , Disp: , Rfl:  .  brinzolamide (AZOPT) 1 % ophthalmic suspension, Place 1 drop into both eyes 2 (two) times daily., Disp: , Rfl:  .  Cholecalciferol (VITAMIN D-1000 MAX ST) 1000 units tablet, Take 1,000 Units by mouth daily., Disp: , Rfl:  .  ELDERBERRY PO, Take by mouth., Disp: , Rfl:  .  Flaxseed, Linseed, (FLAXSEED OIL PO), Take 1 tablet by mouth 2 (two) times daily., Disp: , Rfl:  .  fluticasone (FLOVENT HFA) 110 MCG/ACT inhaler, ONE PUFF TWICE A DAY TO PREVENT COUGH OR WHEEZE. RINSE, GARGLE AND SPIT AFTER USEL, Disp: 12 g, Rfl: 5 .  Omega-3 Fatty Acids (FISH OIL PO), Take 1 capsule by mouth daily., Disp: , Rfl:    Allergies  Allergen Reactions  . Shrimp [Shellfish Allergy] Hives and Shortness Of Breath  . Penicillins Hives and  Itching    Has patient had a PCN reaction causing immediate rash, facial/tongue/throat swelling, SOB or lightheadedness with hypotension: YES Has patient had a PCN reaction causing severe rash involving mucus membranes or skin necrosis: NO  Has patient had a PCN reaction that required hospitalization: NO Has patient had a PCN reaction occurring within the last 10 years: NO If all of the above answers are "NO", then may proceed with Cephalosporin use.   Barbera Setters [Levofloxacin In D5w] Itching    Past Medical History:  Diagnosis Date  . Asthma   . Glaucoma   . High cholesterol      Past Surgical History:  Procedure Laterality Date  . CERVICAL DISCECTOMY    . DENTAL SURGERY    . ECTOPIC PREGNANCY SURGERY    . TONSILLECTOMY      ROS  Objective:   Vitals: BP (!) 175/91 (BP Location: Right Arm)   Pulse 66   Temp 97.9 F (36.6 C) (Oral)   Resp 18   SpO2 98%   BP Readings from Last 3 Encounters:  07/03/18 (!) 175/91  02/09/18 (!) 145/73  10/01/16 122/75   Physical Exam Constitutional:      General: She is not in acute distress.    Appearance: Normal appearance. She is well-developed. She is not ill-appearing, toxic-appearing or diaphoretic.  HENT:     Head: Normocephalic and atraumatic.  Right Ear: Ear canal normal. No drainage or tenderness. No middle ear effusion. Tympanic membrane is not erythematous.     Left Ear: Ear canal normal. No drainage or tenderness.  No middle ear effusion. Tympanic membrane is not erythematous.     Ears:     Comments: TMs opacified bilaterally.  There is no erythema or discharge, TMs are intact.    Nose: Nose normal. No congestion or rhinorrhea.     Comments: Nasal mucosa is mildly boggy and violaceous.    Mouth/Throat:     Mouth: Mucous membranes are moist. No oral lesions.     Pharynx: Oropharynx is clear. No pharyngeal swelling, oropharyngeal exudate, posterior oropharyngeal erythema or uvula swelling.     Tonsils: No tonsillar  exudate or tonsillar abscesses.  Eyes:     Extraocular Movements: Extraocular movements intact.     Right eye: Normal extraocular motion.     Left eye: Normal extraocular motion.     Conjunctiva/sclera: Conjunctivae normal.     Pupils: Pupils are equal, round, and reactive to light.  Neck:     Musculoskeletal: Normal range of motion and neck supple.  Cardiovascular:     Rate and Rhythm: Normal rate and regular rhythm.     Pulses: Normal pulses.     Heart sounds: Normal heart sounds. No murmur. No friction rub. No gallop.   Pulmonary:     Effort: Pulmonary effort is normal. No respiratory distress.     Breath sounds: Normal breath sounds. No stridor. No wheezing, rhonchi or rales.  Lymphadenopathy:     Cervical: No cervical adenopathy.  Skin:    General: Skin is warm and dry.     Findings: No rash.  Neurological:     General: No focal deficit present.     Mental Status: She is alert and oriented to person, place, and time.  Psychiatric:        Mood and Affect: Mood normal.        Behavior: Behavior normal.        Thought Content: Thought content normal.     Assessment and Plan :   Vertigo  Essential hypertension  Allergic rhinitis due to other allergic trigger, unspecified seasonality  Dysfunction of both eustachian tubes  Patient has a history of difficult to control allergies and is unable to take Flonase due to her glaucoma.  She reports that she takes over-the-counter antihistamine daily but is not always consistent.  Recommended that she start taking it every day and add Singulair.  Counseled her on the nature of ETD.  Recommended she start amlodipine at a low dose of 5 mg to address blood pressure as a source of her dizziness and vertigo.  She is to follow-up with her PCP and in the meantime use meclizine for symptomatic relief. Counseled patient on potential for adverse effects with medications prescribed/recommended today, ER and return-to-clinic precautions discussed,  patient verbalized understanding.    Wallis BambergMani, Mannie Ohlin, PA-C 07/03/18 1507

## 2018-07-03 NOTE — Progress Notes (Signed)
Based on what you shared with me, I feel your condition warrants further evaluation and I recommend that you be seen for a face to face office visit.     NOTE: If you entered your credit card information for this eVisit, you will not be charged. You may see a "hold" on your card for the $35 but that hold will drop off and you will not have a charge processed.  If you are having a true medical emergency please call 911.  If you need an urgent face to face visit, [facility] has four urgent care centers for your convenience.    PLEASE NOTE: THE [facility] LOCATIONS AND URGENT CARE CLINICS DO NOT HAVE THE TESTING FOR CORONAVIRUS COVID19 AVAILABLE.  IF YOU FEEL YOU NEED THIS TEST YOU MUST GO TO A TRIAGE LOCATION AT ONE OF THE HOSPITAL EMERGENCY DEPARTMENTS   [email or web address] to reserve your spot online an avoid wait times  [facility] [address] [place], [place] [address] Modified hours of operation: Monday-Friday, 12 PM to 6 PM  Saturday & Sunday 10 AM to 4 PM *Across the street from [employer]  [employer] (New Address!) [address] [place], [place] [address] *Just off [employer], across the road from [place]* Modified hours of operation: Monday-Friday, 12 PM to 6 PM  Closed Saturday & Sunday  [facility]'s modified hours of operation will be in effect from May 1 until May 31   The following sites will take your insurance:  . [facility]  [phone] Get Driving Directions Find a Provider at this Location  [address] [place], [place] [address] . 10 am to 8 pm Monday-Friday . 12 pm to 8 pm Saturday-Sunday   . [facility] at [facility]  [phone] Get Driving Directions Find a Provider at this Location  [address] [place], [place] [address] . 8 am to 8 pm Monday-Friday . 9 am to 6 pm Saturday . 11 am to 6 pm Sunday   . [facility] at [facility]  [phone] Get Driving Directions  [address] [place], [place] [address] . 8 am to 8 pm Monday-Friday . 8 am to 4 pm Saturday-Sunday   Your e-visit answers were reviewed by a board certified advanced clinical practitioner to complete your personal care plan.  Thank you for using e-Visits. a

## 2018-07-03 NOTE — ED Triage Notes (Signed)
Pt here with some ear pain and dizziness upon waking this am with room spinning; pt sts similar happened 2 weeks ago

## 2018-09-09 ENCOUNTER — Other Ambulatory Visit: Payer: Self-pay | Admitting: *Deleted

## 2018-09-09 DIAGNOSIS — Z20822 Contact with and (suspected) exposure to covid-19: Secondary | ICD-10-CM

## 2019-03-11 ENCOUNTER — Other Ambulatory Visit: Payer: Self-pay

## 2019-03-11 ENCOUNTER — Encounter (HOSPITAL_COMMUNITY): Payer: Self-pay | Admitting: Emergency Medicine

## 2019-03-11 ENCOUNTER — Ambulatory Visit (HOSPITAL_COMMUNITY)
Admission: EM | Admit: 2019-03-11 | Discharge: 2019-03-11 | Disposition: A | Discharge status: Home / Self Care | Payer: Medicare PPO | Attending: Family Medicine | Admitting: Family Medicine

## 2019-03-11 DIAGNOSIS — N3000 Acute cystitis without hematuria: Secondary | ICD-10-CM | POA: Diagnosis not present

## 2019-03-11 LAB — POCT URINALYSIS DIP (DEVICE)
Glucose, UA: 250 mg/dL — AB
Hgb urine dipstick: NEGATIVE
Nitrite: POSITIVE — AB
Protein, ur: 100 mg/dL — AB
Specific Gravity, Urine: 1.01 (ref 1.005–1.030)
Urobilinogen, UA: 2 mg/dL — ABNORMAL HIGH (ref 0.0–1.0)
pH: 5 (ref 5.0–8.0)

## 2019-03-11 MED ORDER — CEPHALEXIN 500 MG PO CAPS: 500.0000 mg | ORAL_CAPSULE | Freq: Two times a day (BID) | ORAL | 0 refills | Status: AC

## 2019-03-11 NOTE — ED Triage Notes (Signed)
Short bursts of urine and dysuria for 1 week. Took AZO yesterday and today and it improved. Diarrhea last week as well.

## 2019-03-11 NOTE — Discharge Instructions (Addendum)
We are treating you for a urinary tract infection Take the medication as prescribed.  Make sure you are drinking plenty of fluids. Follow up as needed for continued or worsening symptoms

## 2019-03-12 LAB — URINE CULTURE

## 2019-03-12 NOTE — ED Provider Notes (Signed)
[facility]    CSN: [id number] Arrival date & time: 03/11/19  1357      History   Chief Complaint Chief Complaint  Patient presents with  . Urinary Tract Infection    HPI Kimberly Garza is a 74 y.o. female.   Patient is a 74 year old female with past medical history of asthma, glaucoma, high cholesterol.  She presents today for urinary retention and dysuria x1 week.  Symptoms been constant.  She took AZO yesterday with some improvement in her discomfort.  She is also had some mild diarrhea.  Denies any abdominal pain, back pain, fever, nausea or vomiting.  ROS per HPI      Past Medical History:  Diagnosis Date  . Asthma   . Glaucoma   . High cholesterol     Patient Active Problem List   Diagnosis Date Noted  . Asthma with acute exacerbation 07/18/2015  . Glaucoma 07/18/2015    Past Surgical History:  Procedure Laterality Date  . CERVICAL DISCECTOMY    . DENTAL SURGERY    . ECTOPIC PREGNANCY SURGERY    . TONSILLECTOMY      OB History   No obstetric history on file.      Home Medications    Prior to Admission medications   Medication Sig Start Date End Date Taking? Authorizing Provider  acetaminophen (TYLENOL) 500 MG tablet Take 1,000 mg by mouth every 6 (six) hours as needed for mild pain, moderate pain, fever or headache.    [provider]  albuterol (PROVENTIL HFA;VENTOLIN HFA) 108 (90 Base) MCG/ACT inhaler Inhale 2 puffs into the lungs every 4 (four) hours as needed for wheezing or shortness of breath. 02/09/18   Dahlia Byes A, NP  amLODipine (NORVASC) 5 MG tablet Take 1 tablet (5 mg total) by mouth daily. 07/03/18   Wallis Bamberg, PA-C  bimatoprost (LUMIGAN) 0.03 % ophthalmic solution Place 1 drop into both eyes at bedtime.    [provider]  brimonidine (ALPHAGAN) 0.15 % ophthalmic solution Place 1 drop into both eyes 3 (three) times daily.     [provider]  brinzolamide (AZOPT) 1 % ophthalmic suspension Place  1 drop into both eyes 2 (two) times daily.    [provider]  cephALEXin (KEFLEX) 500 MG capsule Take 1 capsule (500 mg total) by mouth 2 (two) times daily for 7 days. 03/11/19 03/18/19  Dahlia Byes A, NP  Cholecalciferol (VITAMIN D-1000 MAX ST) 1000 units tablet Take 1,000 Units by mouth daily.    [provider]  ELDERBERRY PO Take by mouth.    [provider]  Flaxseed, Linseed, (FLAXSEED OIL PO) Take 1 tablet by mouth 2 (two) times daily.    [provider]  fluticasone (FLOVENT HFA) 110 MCG/ACT inhaler ONE PUFF TWICE A DAY TO PREVENT COUGH OR WHEEZE. RINSE, GARGLE AND SPIT AFTER USEL 07/19/15   Fletcher Anon, MD  meclizine (ANTIVERT) 12.5 MG tablet Take 1 tablet (12.5 mg total) by mouth 3 (three) times daily as needed for dizziness. 07/03/18   Wallis Bamberg, PA-C  montelukast (SINGULAIR) 10 MG tablet Take 1 tablet (10 mg total) by mouth at bedtime. 07/03/18   Wallis Bamberg, PA-C  Omega-3 Fatty Acids (FISH OIL PO) Take 1 capsule by mouth daily.    [provider]    Family History Family History  Problem Relation Age of Onset  . Heart failure Father     Social History Social History   Tobacco Use  .  Smoking status: Former Smoker    Packs/day: 0.10    Types: Cigarettes    Start date: 08/21/1963    Quit date: 08/20/1968    Years since quitting: 50.5  . Smokeless tobacco: Never Used  Substance Use Topics  . Alcohol use: No  . Drug use: No     Allergies   Shrimp [shellfish allergy], Penicillins, and Levaquin [levofloxacin in d5w]   Review of Systems Review of Systems   Physical Exam Triage Vital Signs ED Triage Vitals  Enc Vitals Group     BP 03/11/19 1441 (!) 157/80     Pulse Rate 03/11/19 1441 80     Resp 03/11/19 1441 16     Temp 03/11/19 1441 98.4 F (36.9 C)     Temp Source 03/11/19 1441 Oral     SpO2 03/11/19 1441 98 %     Weight --      Height --      Head Circumference --      Peak Flow --      Pain Score 03/11/19  1442 0     Pain Loc --      Pain Edu? --      Excl. in [place]? --    No data found.  Updated Vital Signs BP (!) 157/80   Pulse 80   Temp 98.4 F (36.9 C) (Oral)   Resp 16   SpO2 98%   Visual Acuity Right Eye Distance:   Left Eye Distance:   Bilateral Distance:    Right Eye Near:   Left Eye Near:    Bilateral Near:     Physical Exam Vitals and nursing note reviewed.  Constitutional:      General: She is not in acute distress.    Appearance: Normal appearance. She is not ill-appearing, toxic-appearing or diaphoretic.  HENT:     Head: Normocephalic.     Nose: Nose normal.  Eyes:     Conjunctiva/sclera: Conjunctivae normal.  Pulmonary:     Effort: Pulmonary effort is normal.  Musculoskeletal:        General: Normal range of motion.     Cervical back: Normal range of motion.  Skin:    General: Skin is warm and dry.     Findings: No rash.  Neurological:     Mental Status: She is alert.  Psychiatric:        Mood and Affect: Mood normal.      UC Treatments / Results  Labs (all labs ordered are listed, but only abnormal results are displayed) Labs Reviewed  POCT URINALYSIS DIP (DEVICE) - Abnormal; Notable for the following components:      Result Value   Glucose, UA 250 (*)    Bilirubin Urine SMALL (*)    Ketones, ur TRACE (*)    Protein, ur 100 (*)    Urobilinogen, UA 2.0 (*)    Nitrite POSITIVE (*)    Leukocytes,Ua LARGE (*)    All other components within normal limits  URINE CULTURE    EKG   Radiology No results found.  Procedures Procedures (including critical care time)  Medications Ordered in UC Medications - No data to display  Initial Impression / Assessment and Plan / UC Course  I have reviewed the triage vital signs and the nursing notes.  Pertinent labs & imaging results that were available during my care of the patient were reviewed by me and considered in my medical decision making (see chart for details).  Acute cystitis  without hematuria.  Patient with large leuks positive nitrates.  Sending for culture. Will treat with Keflex Recommend push fluids Follow up as needed for continued or worsening symptoms  Final Clinical Impressions(s) / UC Diagnoses   Final diagnoses:  Acute cystitis without hematuria     Discharge Instructions     We are treating you for a urinary tract infection Take the medication as prescribed.  Make sure you are drinking plenty of fluids. Follow up as needed for continued or worsening symptoms     ED Prescriptions    Medication Sig Dispense Auth. Provider   cephALEXin (KEFLEX) 500 MG capsule Take 1 capsule (500 mg total) by mouth 2 (two) times daily for 7 days. 14 capsule Arnie Maiolo A, NP     PDMP not reviewed this encounter.   Dahlia Byes A, NP 03/12/19 1018

## 2019-03-17 ENCOUNTER — Telehealth (HOSPITAL_COMMUNITY): Payer: Self-pay | Admitting: Emergency Medicine

## 2019-03-17 ENCOUNTER — Other Ambulatory Visit: Payer: Self-pay | Admitting: Family Medicine

## 2019-03-17 DIAGNOSIS — Z1231 Encounter for screening mammogram for malignant neoplasm of breast: Secondary | ICD-10-CM

## 2019-03-17 NOTE — Telephone Encounter (Signed)
Called patient's phone number [phone].  Only answering machine.  Left vague comment to call urgent care

## 2019-04-16 ENCOUNTER — Other Ambulatory Visit: Payer: Self-pay | Admitting: Family Medicine

## 2019-04-16 DIAGNOSIS — M8580 Other specified disorders of bone density and structure, unspecified site: Secondary | ICD-10-CM

## 2019-04-22 ENCOUNTER — Ambulatory Visit
Admission: RE | Admit: 2019-04-22 | Discharge: 2019-04-22 | Disposition: A | Discharge status: Home / Self Care | Source: Ambulatory Visit | Attending: Family Medicine | Admitting: Family Medicine

## 2019-04-22 ENCOUNTER — Other Ambulatory Visit: Payer: Self-pay

## 2019-04-22 DIAGNOSIS — Z1231 Encounter for screening mammogram for malignant neoplasm of breast: Secondary | ICD-10-CM

## 2019-07-01 ENCOUNTER — Other Ambulatory Visit

## 2019-08-12 DIAGNOSIS — M5430 Sciatica, unspecified side: Secondary | ICD-10-CM | POA: Diagnosis not present

## 2019-08-24 DIAGNOSIS — M5430 Sciatica, unspecified side: Secondary | ICD-10-CM | POA: Diagnosis not present

## 2019-08-24 DIAGNOSIS — K30 Functional dyspepsia: Secondary | ICD-10-CM | POA: Diagnosis not present

## 2019-10-14 DIAGNOSIS — H401493 Capsular glaucoma with pseudoexfoliation of lens, unspecified eye, severe stage: Secondary | ICD-10-CM | POA: Diagnosis not present

## 2019-11-07 ENCOUNTER — Other Ambulatory Visit: Payer: Self-pay

## 2019-11-07 ENCOUNTER — Encounter (HOSPITAL_COMMUNITY): Payer: Self-pay | Admitting: Emergency Medicine

## 2019-11-07 ENCOUNTER — Emergency Department (HOSPITAL_COMMUNITY)
Admission: EM | Admit: 2019-11-07 | Discharge: 2019-11-07 | Disposition: A | Discharge status: Home / Self Care | Payer: Medicare PPO | Attending: Emergency Medicine | Admitting: Emergency Medicine

## 2019-11-07 ENCOUNTER — Ambulatory Visit (HOSPITAL_COMMUNITY)
Admission: EM | Admit: 2019-11-07 | Discharge: 2019-11-07 | Disposition: A | Discharge status: Home / Self Care | Payer: Medicare PPO

## 2019-11-07 DIAGNOSIS — R42 Dizziness and giddiness: Secondary | ICD-10-CM | POA: Insufficient documentation

## 2019-11-07 DIAGNOSIS — Z5321 Procedure and treatment not carried out due to patient leaving prior to being seen by health care provider: Secondary | ICD-10-CM | POA: Diagnosis not present

## 2019-11-07 HISTORY — DX: Essential (primary) hypertension: I10

## 2019-11-07 NOTE — ED Notes (Signed)
Pt turned labels in to EMT 1st and left.

## 2019-11-07 NOTE — ED Triage Notes (Signed)
Pt reports was at dentist on Thursday getting prepped for a root canal and when moved chair fast got dizzy. Reports has been dizzy since. Hx of vertigo

## 2019-12-05 DIAGNOSIS — Z23 Encounter for immunization: Secondary | ICD-10-CM | POA: Diagnosis not present

## 2019-12-08 DIAGNOSIS — Z87898 Personal history of other specified conditions: Secondary | ICD-10-CM | POA: Diagnosis not present

## 2019-12-08 DIAGNOSIS — H6122 Impacted cerumen, left ear: Secondary | ICD-10-CM | POA: Diagnosis not present

## 2019-12-08 DIAGNOSIS — H903 Sensorineural hearing loss, bilateral: Secondary | ICD-10-CM | POA: Diagnosis not present

## 2020-01-06 DIAGNOSIS — N7689 Other specified inflammation of vagina and vulva: Secondary | ICD-10-CM | POA: Diagnosis not present

## 2020-01-06 DIAGNOSIS — Z7251 High risk heterosexual behavior: Secondary | ICD-10-CM | POA: Diagnosis not present

## 2020-01-07 DIAGNOSIS — H903 Sensorineural hearing loss, bilateral: Secondary | ICD-10-CM | POA: Diagnosis not present

## 2020-02-09 DIAGNOSIS — Z1152 Encounter for screening for COVID-19: Secondary | ICD-10-CM | POA: Diagnosis not present

## 2020-02-12 DIAGNOSIS — D122 Benign neoplasm of ascending colon: Secondary | ICD-10-CM | POA: Diagnosis not present

## 2020-02-12 DIAGNOSIS — K635 Polyp of colon: Secondary | ICD-10-CM | POA: Diagnosis not present

## 2020-02-12 DIAGNOSIS — D128 Benign neoplasm of rectum: Secondary | ICD-10-CM | POA: Diagnosis not present

## 2020-02-12 DIAGNOSIS — Z1211 Encounter for screening for malignant neoplasm of colon: Secondary | ICD-10-CM | POA: Diagnosis not present

## 2020-02-12 DIAGNOSIS — K5730 Diverticulosis of large intestine without perforation or abscess without bleeding: Secondary | ICD-10-CM | POA: Diagnosis not present

## 2020-02-12 DIAGNOSIS — K648 Other hemorrhoids: Secondary | ICD-10-CM | POA: Diagnosis not present

## 2020-02-12 DIAGNOSIS — D120 Benign neoplasm of cecum: Secondary | ICD-10-CM | POA: Diagnosis not present

## 2020-02-12 DIAGNOSIS — K644 Residual hemorrhoidal skin tags: Secondary | ICD-10-CM | POA: Diagnosis not present

## 2020-02-17 DIAGNOSIS — D128 Benign neoplasm of rectum: Secondary | ICD-10-CM | POA: Diagnosis not present

## 2020-02-17 DIAGNOSIS — D122 Benign neoplasm of ascending colon: Secondary | ICD-10-CM | POA: Diagnosis not present

## 2020-02-17 DIAGNOSIS — K635 Polyp of colon: Secondary | ICD-10-CM | POA: Diagnosis not present

## 2020-02-17 DIAGNOSIS — D120 Benign neoplasm of cecum: Secondary | ICD-10-CM | POA: Diagnosis not present

## 2020-03-29 ENCOUNTER — Other Ambulatory Visit: Payer: Self-pay | Admitting: Family Medicine

## 2020-03-29 DIAGNOSIS — Z1231 Encounter for screening mammogram for malignant neoplasm of breast: Secondary | ICD-10-CM

## 2020-05-02 DIAGNOSIS — E7800 Pure hypercholesterolemia, unspecified: Secondary | ICD-10-CM | POA: Diagnosis not present

## 2020-05-02 DIAGNOSIS — J309 Allergic rhinitis, unspecified: Secondary | ICD-10-CM | POA: Diagnosis not present

## 2020-05-02 DIAGNOSIS — Z0000 Encounter for general adult medical examination without abnormal findings: Secondary | ICD-10-CM | POA: Diagnosis not present

## 2020-05-02 DIAGNOSIS — I10 Essential (primary) hypertension: Secondary | ICD-10-CM | POA: Diagnosis not present

## 2020-05-02 DIAGNOSIS — Z1389 Encounter for screening for other disorder: Secondary | ICD-10-CM | POA: Diagnosis not present

## 2020-05-02 DIAGNOSIS — J45909 Unspecified asthma, uncomplicated: Secondary | ICD-10-CM | POA: Diagnosis not present

## 2020-05-02 DIAGNOSIS — G720 Drug-induced myopathy: Secondary | ICD-10-CM | POA: Diagnosis not present

## 2020-05-02 DIAGNOSIS — M859 Disorder of bone density and structure, unspecified: Secondary | ICD-10-CM | POA: Diagnosis not present

## 2020-05-02 DIAGNOSIS — K219 Gastro-esophageal reflux disease without esophagitis: Secondary | ICD-10-CM | POA: Diagnosis not present

## 2020-05-04 DIAGNOSIS — H401493 Capsular glaucoma with pseudoexfoliation of lens, unspecified eye, severe stage: Secondary | ICD-10-CM | POA: Diagnosis not present

## 2020-05-06 ENCOUNTER — Other Ambulatory Visit: Payer: Self-pay | Admitting: Family Medicine

## 2020-05-06 DIAGNOSIS — M8580 Other specified disorders of bone density and structure, unspecified site: Secondary | ICD-10-CM

## 2020-05-20 ENCOUNTER — Ambulatory Visit: Payer: Medicare PPO

## 2020-07-06 ENCOUNTER — Ambulatory Visit: Payer: Medicare PPO

## 2020-07-09 ENCOUNTER — Ambulatory Visit
Admission: RE | Admit: 2020-07-09 | Discharge: 2020-07-09 | Disposition: A | Discharge status: Home / Self Care | Payer: Medicare PPO | Source: Ambulatory Visit | Attending: Family Medicine | Admitting: Family Medicine

## 2020-07-09 DIAGNOSIS — Z1231 Encounter for screening mammogram for malignant neoplasm of breast: Secondary | ICD-10-CM | POA: Diagnosis not present

## 2020-09-21 ENCOUNTER — Other Ambulatory Visit: Payer: Self-pay | Admitting: Internal Medicine

## 2020-09-21 DIAGNOSIS — G8929 Other chronic pain: Secondary | ICD-10-CM | POA: Diagnosis not present

## 2020-09-21 DIAGNOSIS — Z113 Encounter for screening for infections with a predominantly sexual mode of transmission: Secondary | ICD-10-CM | POA: Diagnosis not present

## 2020-09-21 DIAGNOSIS — R1031 Right lower quadrant pain: Secondary | ICD-10-CM | POA: Diagnosis not present

## 2020-09-21 DIAGNOSIS — M25511 Pain in right shoulder: Secondary | ICD-10-CM | POA: Diagnosis not present

## 2020-09-22 ENCOUNTER — Ambulatory Visit
Admission: RE | Admit: 2020-09-22 | Discharge: 2020-09-22 | Disposition: A | Discharge status: Home / Self Care | Payer: Medicare PPO | Source: Ambulatory Visit | Attending: Internal Medicine | Admitting: Internal Medicine

## 2020-09-22 ENCOUNTER — Other Ambulatory Visit: Payer: Self-pay

## 2020-09-22 DIAGNOSIS — M1990 Unspecified osteoarthritis, unspecified site: Secondary | ICD-10-CM | POA: Diagnosis not present

## 2020-09-22 DIAGNOSIS — Z8249 Family history of ischemic heart disease and other diseases of the circulatory system: Secondary | ICD-10-CM | POA: Diagnosis not present

## 2020-09-22 DIAGNOSIS — I10 Essential (primary) hypertension: Secondary | ICD-10-CM | POA: Diagnosis not present

## 2020-09-22 DIAGNOSIS — K219 Gastro-esophageal reflux disease without esophagitis: Secondary | ICD-10-CM | POA: Diagnosis not present

## 2020-09-22 DIAGNOSIS — J45909 Unspecified asthma, uncomplicated: Secondary | ICD-10-CM | POA: Diagnosis not present

## 2020-09-22 DIAGNOSIS — I951 Orthostatic hypotension: Secondary | ICD-10-CM | POA: Diagnosis not present

## 2020-09-22 DIAGNOSIS — I739 Peripheral vascular disease, unspecified: Secondary | ICD-10-CM | POA: Diagnosis not present

## 2020-09-22 DIAGNOSIS — H409 Unspecified glaucoma: Secondary | ICD-10-CM | POA: Diagnosis not present

## 2020-09-22 DIAGNOSIS — G8929 Other chronic pain: Secondary | ICD-10-CM | POA: Diagnosis not present

## 2020-09-22 DIAGNOSIS — R1031 Right lower quadrant pain: Secondary | ICD-10-CM

## 2020-10-24 ENCOUNTER — Other Ambulatory Visit: Payer: Self-pay | Admitting: Family Medicine

## 2020-10-24 DIAGNOSIS — I999 Unspecified disorder of circulatory system: Secondary | ICD-10-CM

## 2020-11-01 ENCOUNTER — Ambulatory Visit
Admission: RE | Admit: 2020-11-01 | Discharge: 2020-11-01 | Disposition: A | Discharge status: Home / Self Care | Payer: Medicare PPO | Source: Ambulatory Visit | Attending: Family Medicine | Admitting: Family Medicine

## 2020-11-01 DIAGNOSIS — I999 Unspecified disorder of circulatory system: Secondary | ICD-10-CM

## 2020-11-01 DIAGNOSIS — I70213 Atherosclerosis of native arteries of extremities with intermittent claudication, bilateral legs: Secondary | ICD-10-CM | POA: Diagnosis not present

## 2020-11-07 DIAGNOSIS — H401433 Capsular glaucoma with pseudoexfoliation of lens, bilateral, severe stage: Secondary | ICD-10-CM | POA: Diagnosis not present

## 2020-11-07 DIAGNOSIS — H2513 Age-related nuclear cataract, bilateral: Secondary | ICD-10-CM | POA: Diagnosis not present

## 2020-11-07 DIAGNOSIS — H35363 Drusen (degenerative) of macula, bilateral: Secondary | ICD-10-CM | POA: Diagnosis not present

## 2020-11-09 DIAGNOSIS — K621 Rectal polyp: Secondary | ICD-10-CM | POA: Diagnosis not present

## 2020-11-09 DIAGNOSIS — Z86010 Personal history of colonic polyps: Secondary | ICD-10-CM | POA: Diagnosis not present

## 2020-11-09 DIAGNOSIS — K648 Other hemorrhoids: Secondary | ICD-10-CM | POA: Diagnosis not present

## 2020-11-09 DIAGNOSIS — K644 Residual hemorrhoidal skin tags: Secondary | ICD-10-CM | POA: Diagnosis not present

## 2020-11-11 ENCOUNTER — Ambulatory Visit
Admission: RE | Admit: 2020-11-11 | Discharge: 2020-11-11 | Disposition: A | Discharge status: Home / Self Care | Payer: Medicare PPO | Source: Ambulatory Visit | Attending: Family Medicine | Admitting: Family Medicine

## 2020-11-11 ENCOUNTER — Other Ambulatory Visit: Payer: Self-pay

## 2020-11-11 DIAGNOSIS — M810 Age-related osteoporosis without current pathological fracture: Secondary | ICD-10-CM | POA: Diagnosis not present

## 2020-11-11 DIAGNOSIS — M85851 Other specified disorders of bone density and structure, right thigh: Secondary | ICD-10-CM | POA: Diagnosis not present

## 2020-11-11 DIAGNOSIS — M8580 Other specified disorders of bone density and structure, unspecified site: Secondary | ICD-10-CM

## 2020-11-11 DIAGNOSIS — Z780 Asymptomatic menopausal state: Secondary | ICD-10-CM | POA: Diagnosis not present

## 2020-11-15 DIAGNOSIS — K621 Rectal polyp: Secondary | ICD-10-CM | POA: Diagnosis not present

## 2020-11-21 DIAGNOSIS — G720 Drug-induced myopathy: Secondary | ICD-10-CM | POA: Diagnosis not present

## 2020-11-21 DIAGNOSIS — J309 Allergic rhinitis, unspecified: Secondary | ICD-10-CM | POA: Diagnosis not present

## 2020-11-21 DIAGNOSIS — E7800 Pure hypercholesterolemia, unspecified: Secondary | ICD-10-CM | POA: Diagnosis not present

## 2020-11-21 DIAGNOSIS — R0989 Other specified symptoms and signs involving the circulatory and respiratory systems: Secondary | ICD-10-CM | POA: Diagnosis not present

## 2020-11-21 DIAGNOSIS — I10 Essential (primary) hypertension: Secondary | ICD-10-CM | POA: Diagnosis not present

## 2021-02-26 DIAGNOSIS — S8002XA Contusion of left knee, initial encounter: Secondary | ICD-10-CM | POA: Diagnosis not present

## 2021-04-19 DIAGNOSIS — H401493 Capsular glaucoma with pseudoexfoliation of lens, unspecified eye, severe stage: Secondary | ICD-10-CM | POA: Diagnosis not present

## 2021-05-11 DIAGNOSIS — H1089 Other conjunctivitis: Secondary | ICD-10-CM | POA: Diagnosis not present

## 2021-05-18 DIAGNOSIS — H1089 Other conjunctivitis: Secondary | ICD-10-CM | POA: Diagnosis not present

## 2021-06-06 ENCOUNTER — Other Ambulatory Visit: Payer: Self-pay | Admitting: Internal Medicine

## 2021-06-06 DIAGNOSIS — Z1231 Encounter for screening mammogram for malignant neoplasm of breast: Secondary | ICD-10-CM

## 2021-07-10 ENCOUNTER — Ambulatory Visit: Payer: Medicare PPO

## 2021-07-14 ENCOUNTER — Ambulatory Visit
Admission: RE | Admit: 2021-07-14 | Discharge: 2021-07-14 | Disposition: A | Discharge status: Home / Self Care | Payer: Medicare PPO | Source: Ambulatory Visit | Attending: Internal Medicine | Admitting: Internal Medicine

## 2021-07-14 DIAGNOSIS — Z1231 Encounter for screening mammogram for malignant neoplasm of breast: Secondary | ICD-10-CM | POA: Diagnosis not present

## 2021-09-11 DIAGNOSIS — H401433 Capsular glaucoma with pseudoexfoliation of lens, bilateral, severe stage: Secondary | ICD-10-CM | POA: Diagnosis not present

## 2021-09-11 DIAGNOSIS — H16143 Punctate keratitis, bilateral: Secondary | ICD-10-CM | POA: Diagnosis not present

## 2021-09-11 DIAGNOSIS — H18413 Arcus senilis, bilateral: Secondary | ICD-10-CM | POA: Diagnosis not present

## 2021-09-11 DIAGNOSIS — H2512 Age-related nuclear cataract, left eye: Secondary | ICD-10-CM | POA: Diagnosis not present

## 2021-09-11 DIAGNOSIS — Z961 Presence of intraocular lens: Secondary | ICD-10-CM | POA: Diagnosis not present

## 2021-09-11 DIAGNOSIS — H35363 Drusen (degenerative) of macula, bilateral: Secondary | ICD-10-CM | POA: Diagnosis not present

## 2021-09-11 DIAGNOSIS — H02403 Unspecified ptosis of bilateral eyelids: Secondary | ICD-10-CM | POA: Diagnosis not present

## 2021-09-12 DIAGNOSIS — E559 Vitamin D deficiency, unspecified: Secondary | ICD-10-CM | POA: Diagnosis not present

## 2021-09-12 DIAGNOSIS — K219 Gastro-esophageal reflux disease without esophagitis: Secondary | ICD-10-CM | POA: Diagnosis not present

## 2021-09-12 DIAGNOSIS — E7800 Pure hypercholesterolemia, unspecified: Secondary | ICD-10-CM | POA: Diagnosis not present

## 2021-09-12 DIAGNOSIS — I10 Essential (primary) hypertension: Secondary | ICD-10-CM | POA: Diagnosis not present

## 2021-09-12 DIAGNOSIS — Z1331 Encounter for screening for depression: Secondary | ICD-10-CM | POA: Diagnosis not present

## 2021-09-12 DIAGNOSIS — G4733 Obstructive sleep apnea (adult) (pediatric): Secondary | ICD-10-CM | POA: Diagnosis not present

## 2021-09-12 DIAGNOSIS — G4700 Insomnia, unspecified: Secondary | ICD-10-CM | POA: Diagnosis not present

## 2021-09-12 DIAGNOSIS — Z0000 Encounter for general adult medical examination without abnormal findings: Secondary | ICD-10-CM | POA: Diagnosis not present

## 2021-09-12 DIAGNOSIS — G4730 Sleep apnea, unspecified: Secondary | ICD-10-CM | POA: Diagnosis not present

## 2021-11-06 DIAGNOSIS — Z09 Encounter for follow-up examination after completed treatment for conditions other than malignant neoplasm: Secondary | ICD-10-CM | POA: Diagnosis not present

## 2021-11-06 DIAGNOSIS — D128 Benign neoplasm of rectum: Secondary | ICD-10-CM | POA: Diagnosis not present

## 2021-11-06 DIAGNOSIS — Z9889 Other specified postprocedural states: Secondary | ICD-10-CM | POA: Diagnosis not present

## 2021-11-06 DIAGNOSIS — K5730 Diverticulosis of large intestine without perforation or abscess without bleeding: Secondary | ICD-10-CM | POA: Diagnosis not present

## 2021-11-06 DIAGNOSIS — K644 Residual hemorrhoidal skin tags: Secondary | ICD-10-CM | POA: Diagnosis not present

## 2021-11-06 DIAGNOSIS — Z86018 Personal history of other benign neoplasm: Secondary | ICD-10-CM | POA: Diagnosis not present

## 2021-11-09 DIAGNOSIS — D128 Benign neoplasm of rectum: Secondary | ICD-10-CM | POA: Diagnosis not present

## 2021-12-06 DIAGNOSIS — J209 Acute bronchitis, unspecified: Secondary | ICD-10-CM | POA: Diagnosis not present

## 2021-12-06 DIAGNOSIS — Z20822 Contact with and (suspected) exposure to covid-19: Secondary | ICD-10-CM | POA: Diagnosis not present

## 2022-01-03 ENCOUNTER — Other Ambulatory Visit: Payer: Self-pay

## 2022-01-03 ENCOUNTER — Encounter (HOSPITAL_BASED_OUTPATIENT_CLINIC_OR_DEPARTMENT_OTHER): Payer: Self-pay

## 2022-01-03 ENCOUNTER — Emergency Department (HOSPITAL_BASED_OUTPATIENT_CLINIC_OR_DEPARTMENT_OTHER)
Admission: EM | Admit: 2022-01-03 | Discharge: 2022-01-04 | Discharge status: Against Medical Advice | Payer: Medicare PPO | Attending: Emergency Medicine | Admitting: Emergency Medicine

## 2022-01-03 DIAGNOSIS — S60311A Abrasion of right thumb, initial encounter: Secondary | ICD-10-CM | POA: Diagnosis not present

## 2022-01-03 DIAGNOSIS — W19XXXA Unspecified fall, initial encounter: Secondary | ICD-10-CM | POA: Insufficient documentation

## 2022-01-03 DIAGNOSIS — Z5321 Procedure and treatment not carried out due to patient leaving prior to being seen by health care provider: Secondary | ICD-10-CM | POA: Insufficient documentation

## 2022-01-03 MED ORDER — IBUPROFEN 400 MG PO TABS
600.0000 mg | ORAL_TABLET | ORAL | Status: AC
  Administered 2022-01-03: 600 mg via ORAL
  Filled 2022-01-03: qty 1

## 2022-01-03 NOTE — ED Triage Notes (Signed)
Patient arrives POV c/o fall; pt fall on her right side of body inside of a bus. Pt denies LOC. Pt a& x4. No physical injuries to face arm or legs present; patient reports abrasion to right thumb.

## 2022-08-14 ENCOUNTER — Other Ambulatory Visit: Payer: Self-pay | Admitting: Internal Medicine

## 2022-08-14 DIAGNOSIS — Z1231 Encounter for screening mammogram for malignant neoplasm of breast: Secondary | ICD-10-CM

## 2022-08-15 ENCOUNTER — Ambulatory Visit: Payer: Medicare PPO

## 2022-08-17 ENCOUNTER — Ambulatory Visit
Admission: RE | Admit: 2022-08-17 | Discharge: 2022-08-17 | Disposition: A | Discharge status: Home / Self Care | Payer: Medicare PPO | Source: Ambulatory Visit | Attending: Internal Medicine | Admitting: Internal Medicine

## 2022-08-17 DIAGNOSIS — Z1231 Encounter for screening mammogram for malignant neoplasm of breast: Secondary | ICD-10-CM | POA: Diagnosis not present

## 2022-08-21 ENCOUNTER — Emergency Department (HOSPITAL_COMMUNITY)
Admission: EM | Admit: 2022-08-21 | Discharge: 2022-08-21 | Disposition: A | Discharge status: Home / Self Care | Payer: Medicare PPO | Attending: Emergency Medicine | Admitting: Emergency Medicine

## 2022-08-21 ENCOUNTER — Emergency Department (HOSPITAL_COMMUNITY): Payer: Medicare PPO

## 2022-08-21 ENCOUNTER — Other Ambulatory Visit: Payer: Self-pay

## 2022-08-21 DIAGNOSIS — M79642 Pain in left hand: Secondary | ICD-10-CM | POA: Insufficient documentation

## 2022-08-21 DIAGNOSIS — M25841 Other specified joint disorders, right hand: Secondary | ICD-10-CM | POA: Diagnosis not present

## 2022-08-21 DIAGNOSIS — M19041 Primary osteoarthritis, right hand: Secondary | ICD-10-CM | POA: Diagnosis not present

## 2022-08-21 DIAGNOSIS — S0093XA Contusion of unspecified part of head, initial encounter: Secondary | ICD-10-CM | POA: Diagnosis not present

## 2022-08-21 DIAGNOSIS — W010XXA Fall on same level from slipping, tripping and stumbling without subsequent striking against object, initial encounter: Secondary | ICD-10-CM | POA: Diagnosis not present

## 2022-08-21 DIAGNOSIS — S3993XA Unspecified injury of pelvis, initial encounter: Secondary | ICD-10-CM | POA: Diagnosis not present

## 2022-08-21 DIAGNOSIS — Y92195 Garage of other specified residential institution as the place of occurrence of the external cause: Secondary | ICD-10-CM | POA: Insufficient documentation

## 2022-08-21 DIAGNOSIS — S6991XA Unspecified injury of right wrist, hand and finger(s), initial encounter: Secondary | ICD-10-CM | POA: Diagnosis not present

## 2022-08-21 DIAGNOSIS — M79641 Pain in right hand: Secondary | ICD-10-CM | POA: Insufficient documentation

## 2022-08-21 DIAGNOSIS — S199XXA Unspecified injury of neck, initial encounter: Secondary | ICD-10-CM | POA: Diagnosis not present

## 2022-08-21 DIAGNOSIS — W19XXXA Unspecified fall, initial encounter: Secondary | ICD-10-CM

## 2022-08-21 DIAGNOSIS — Z981 Arthrodesis status: Secondary | ICD-10-CM | POA: Diagnosis not present

## 2022-08-21 DIAGNOSIS — S0993XA Unspecified injury of face, initial encounter: Secondary | ICD-10-CM | POA: Diagnosis not present

## 2022-08-21 DIAGNOSIS — M19042 Primary osteoarthritis, left hand: Secondary | ICD-10-CM | POA: Diagnosis not present

## 2022-08-21 DIAGNOSIS — S0990XA Unspecified injury of head, initial encounter: Secondary | ICD-10-CM

## 2022-08-21 DIAGNOSIS — S6992XA Unspecified injury of left wrist, hand and finger(s), initial encounter: Secondary | ICD-10-CM | POA: Diagnosis not present

## 2022-08-21 DIAGNOSIS — M1812 Unilateral primary osteoarthritis of first carpometacarpal joint, left hand: Secondary | ICD-10-CM | POA: Diagnosis not present

## 2022-08-21 NOTE — ED Triage Notes (Signed)
Pt reports tripping over her lawnmower wheel, injuring the rt side of her face, her right hand, and her tailbone.  Denies LOC or blood thinners.

## 2022-08-21 NOTE — ED Provider Notes (Signed)
[place] EMERGENCY DEPARTMENT AT [facility] Provider Note   CSN: [id number] Arrival date & time: 08/21/22  1246     History  Chief Complaint  Patient presents with   Fall   Head Injury    Kimberly Garza is a 77 y.o. female fell last night tripped over her lawnmower wheel in the garage. Fell onto sement floor. She hurt the R side of her head  and face, R hand and tailbone. She did not loose consiousness. She also lammed her left arm against machinery trying to catch herself with her fall.  She denies loss of consciousness.  She does not take any blood thinners.   Fall  Head Injury      Home Medications Prior to Admission medications   Medication Sig Start Date End Date Taking? Authorizing Provider  acetaminophen (TYLENOL) 500 MG tablet Take 1,000 mg by mouth every 6 (six) hours as needed for mild pain, moderate pain, fever or headache.    [provider]  albuterol (PROVENTIL HFA;VENTOLIN HFA) 108 (90 Base) MCG/ACT inhaler Inhale 2 puffs into the lungs every 4 (four) hours as needed for wheezing or shortness of breath. 02/09/18   Dahlia Byes A, NP  amLODipine (NORVASC) 5 MG tablet Take 1 tablet (5 mg total) by mouth daily. 07/03/18   Wallis Bamberg, PA-C  bimatoprost (LUMIGAN) 0.03 % ophthalmic solution Place 1 drop into both eyes at bedtime.    [provider]  brimonidine (ALPHAGAN) 0.15 % ophthalmic solution Place 1 drop into both eyes 3 (three) times daily.     [provider]  brinzolamide (AZOPT) 1 % ophthalmic suspension Place 1 drop into both eyes 2 (two) times daily.    [provider]  Cholecalciferol (VITAMIN D-1000 MAX ST) 1000 units tablet Take 1,000 Units by mouth daily.    [provider]  ELDERBERRY PO Take by mouth.    [provider]  Flaxseed, Linseed, (FLAXSEED OIL PO) Take 1 tablet by mouth 2 (two) times daily.    [provider]  fluticasone (FLOVENT HFA) 110 MCG/ACT inhaler ONE PUFF  TWICE A DAY TO PREVENT COUGH OR WHEEZE. RINSE, GARGLE AND SPIT AFTER USEL 07/19/15   Fletcher Anon, MD  meclizine (ANTIVERT) 12.5 MG tablet Take 1 tablet (12.5 mg total) by mouth 3 (three) times daily as needed for dizziness. 07/03/18   Wallis Bamberg, PA-C  montelukast (SINGULAIR) 10 MG tablet Take 1 tablet (10 mg total) by mouth at bedtime. 07/03/18   Wallis Bamberg, PA-C  Omega-3 Fatty Acids (FISH OIL PO) Take 1 capsule by mouth daily.    [provider]      Allergies    Shrimp [shellfish allergy], Penicillins, and Levaquin [levofloxacin in d5w]    Review of Systems   Review of Systems  Physical Exam Updated Vital Signs BP (!) 164/76 (BP Location: Left Arm)   Pulse 78   Temp 97.7 F (36.5 C) (Oral)   Resp 18   Ht 5\' 6"  (1.676 m)   Wt 70.3 kg   SpO2 99%   BMI 25.02 kg/m  Physical Exam Vitals and nursing note reviewed.  Constitutional:      General: She is not in acute distress.    Appearance: She is well-developed. She is not diaphoretic.  HENT:     Head: Normocephalic.     Comments: Right side of the head with hematoma noted.  Tenderness over the right zygoma without obvious bruising    Right Ear: External  ear normal.     Left Ear: External ear normal.     Nose: Nose normal.     Mouth/Throat:     Mouth: Mucous membranes are moist.     Pharynx: No oropharyngeal exudate.  Eyes:     General: No scleral icterus.    Extraocular Movements: Extraocular movements intact.     Conjunctiva/sclera: Conjunctivae normal.     Pupils: Pupils are equal, round, and reactive to light.  Neck:     Thyroid: No thyromegaly.     Vascular: No JVD.  Cardiovascular:     Rate and Rhythm: Normal rate and regular rhythm.     Heart sounds: Normal heart sounds. No murmur heard.    No friction rub. No gallop.  Pulmonary:     Effort: Pulmonary effort is normal. No respiratory distress.     Breath sounds: Normal breath sounds.  Abdominal:     General: Bowel sounds are normal. There is no  distension.     Palpations: Abdomen is soft. There is no mass.     Tenderness: There is no abdominal tenderness. There is no guarding.  Musculoskeletal:        General: No tenderness. Normal range of motion.     Cervical back: Normal range of motion and neck supple.     Comments: Bilateral hand tenderness without obvious deformity  Skin:    General: Skin is warm and dry.     Findings: No rash.  Neurological:     Mental Status: She is alert and oriented to person, place, and time.     Cranial Nerves: No cranial nerve deficit.     Coordination: Coordination normal.     Deep Tendon Reflexes: Reflexes are normal and symmetric.     Comments: Speech is clear and goal oriented, follows commands Major Cranial nerves without deficit, no facial droop Normal strength in upper and lower extremities bilaterally including dorsiflexion and plantar flexion, strong and equal grip strength Sensation normal to light and sharp touch Moves extremities without ataxia, coordination intact Normal finger to nose and rapid alternating movements Neg romberg, no pronator drift Normal gait Normal heel-shin and balance   Psychiatric:        Behavior: Behavior normal.     ED Results / Procedures / Treatments   Labs (all labs ordered are listed, but only abnormal results are displayed) Labs Reviewed - No data to display  EKG None  Radiology No results found.  Procedures Procedures    Medications Ordered in ED Medications - No data to display  ED Course/ Medical Decision Making/ A&P Clinical Course as of 08/21/22 2146  Tue Aug 21, 2022  1753 DG Hand Complete Left [AH]  1753 DG Hand Complete Right [AH]  1753 DG Pelvis 1-2 Views [AH]  1753 CT HEAD WO CONTRAST [AH]  1753 CT CERVICAL SPINE WO CONTRAST [AH]  1753 CT MAXILLOFACIAL WO CONTRAST Visualized and interpreted all of the imaging.  No acute findings noted. [AH]    Clinical Course User Index [AH] Arthor Captain, PA-C                              Medical Decision Making Amount and/or Complexity of Data Reviewed Radiology: ordered. Decision-making details documented in ED Course.   77 year old female here with mechanical fall. No suspicion for nonaccidental trauma, seizure or syncope.  I ordered visualized and interpreted plain films and CT scans as discussed in ED course.  No acute findings.  Will discharge with supportive care, return precautions.  Appropriate for discharge at this time        Final Clinical Impression(s) / ED Diagnoses Final diagnoses:  None    Rx / DC Orders ED Discharge Orders     None         Arthor Captain, PA-C 08/21/22 2147    Bethann Berkshire, MD 08/22/22 1350

## 2022-08-21 NOTE — Discharge Instructions (Signed)
Your CT scans and x-rays are all negative for any abnormalities.  This is likely just regular soreness after fall.  You do have symptoms of a minor head injury including a headache.  Take Tylenol or Motrin.  There is no evidence of broken skull or bleeding and no evidence of facial fracture. Get help right away if: You have sudden: Severe headache. Severe vomiting. Unequal pupil size. One is bigger than the other. Vision problems. Confusion or irritability. You have a seizure. Your symptoms get worse. You have clear or bloody fluid coming from your nose or ears. These symptoms may be an emergency. Get help right away. Call 911.

## 2022-08-30 ENCOUNTER — Ambulatory Visit
Admission: RE | Admit: 2022-08-30 | Discharge: 2022-08-30 | Disposition: A | Discharge status: Home / Self Care | Payer: Medicare PPO | Source: Ambulatory Visit | Attending: Family Medicine | Admitting: Family Medicine

## 2022-08-30 ENCOUNTER — Other Ambulatory Visit: Payer: Self-pay | Admitting: Family Medicine

## 2022-08-30 DIAGNOSIS — M1990 Unspecified osteoarthritis, unspecified site: Secondary | ICD-10-CM

## 2022-08-30 DIAGNOSIS — M19041 Primary osteoarthritis, right hand: Secondary | ICD-10-CM | POA: Diagnosis not present

## 2022-08-30 DIAGNOSIS — M130 Polyarthritis, unspecified: Secondary | ICD-10-CM | POA: Diagnosis not present

## 2022-08-30 DIAGNOSIS — R7303 Prediabetes: Secondary | ICD-10-CM | POA: Diagnosis not present

## 2022-08-30 DIAGNOSIS — I10 Essential (primary) hypertension: Secondary | ICD-10-CM | POA: Diagnosis not present

## 2022-08-30 DIAGNOSIS — F411 Generalized anxiety disorder: Secondary | ICD-10-CM | POA: Diagnosis not present

## 2022-08-30 DIAGNOSIS — E782 Mixed hyperlipidemia: Secondary | ICD-10-CM | POA: Diagnosis not present

## 2022-08-30 DIAGNOSIS — H401131 Primary open-angle glaucoma, bilateral, mild stage: Secondary | ICD-10-CM | POA: Diagnosis not present

## 2022-08-30 DIAGNOSIS — E559 Vitamin D deficiency, unspecified: Secondary | ICD-10-CM | POA: Diagnosis not present

## 2022-09-20 DIAGNOSIS — H401131 Primary open-angle glaucoma, bilateral, mild stage: Secondary | ICD-10-CM | POA: Diagnosis not present

## 2022-09-20 DIAGNOSIS — R7303 Prediabetes: Secondary | ICD-10-CM | POA: Diagnosis not present

## 2022-09-20 DIAGNOSIS — E782 Mixed hyperlipidemia: Secondary | ICD-10-CM | POA: Diagnosis not present

## 2022-10-07 IMAGING — MG MM DIGITAL SCREENING BILAT W/ TOMO AND CAD
8 series · 8 of 24 positions shown · non-contrast
Comparison: Previous exam(s).

CLINICAL DATA: Screening.

EXAM:
DIGITAL SCREENING BILATERAL MAMMOGRAM WITH TOMOSYNTHESIS AND CAD
TECHNIQUE: Bilateral screening digital craniocaudal and mediolateral oblique
mammograms were obtained. Bilateral screening digital breast
tomosynthesis was performed. The images were evaluated with
computer-aided detection.

[L CC synth-2D]
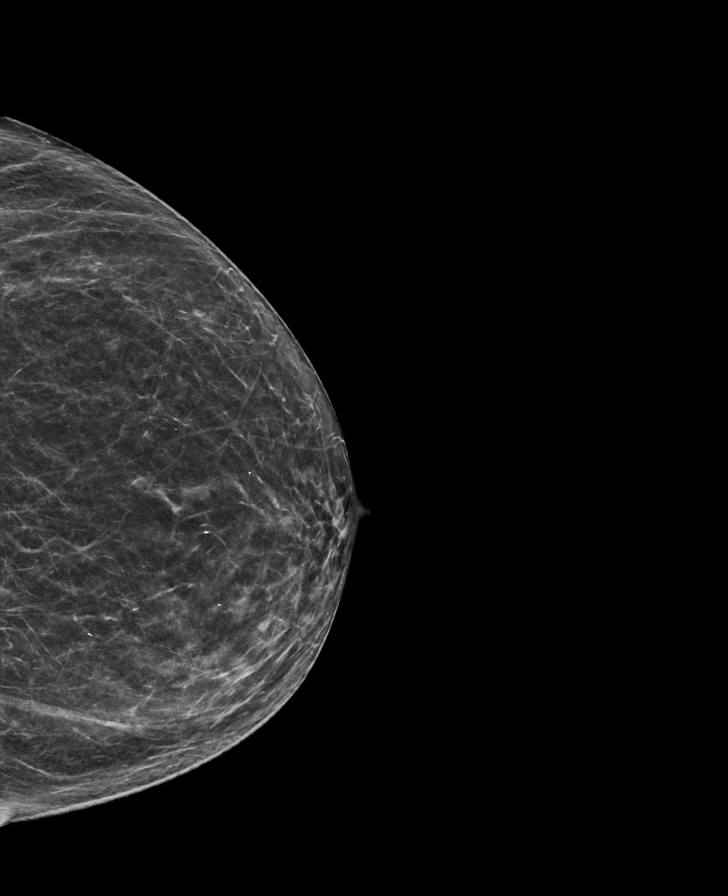

[R CC synth-2D]
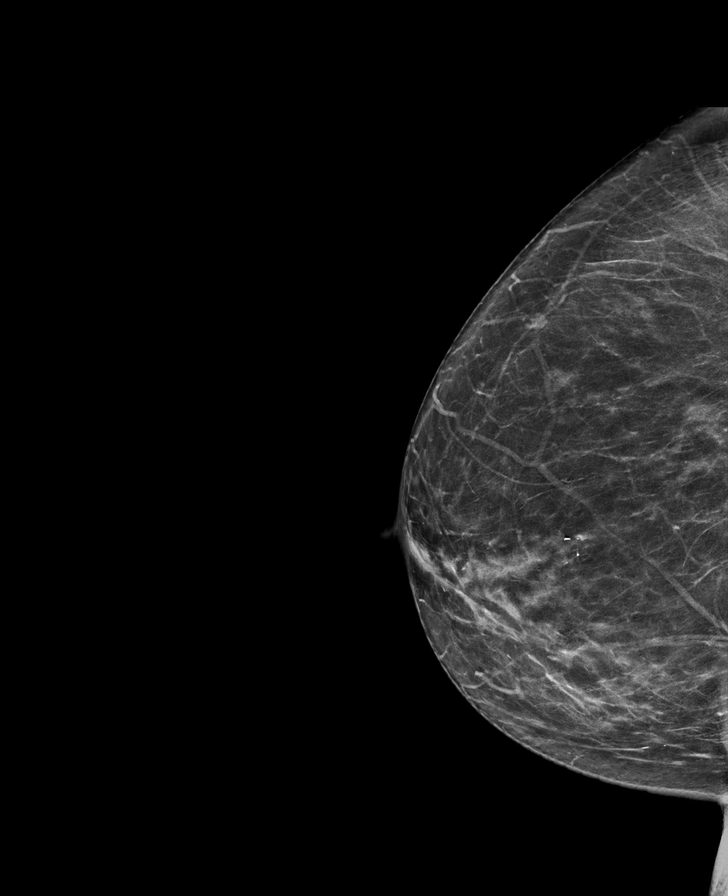

[L MLO synth-2D]
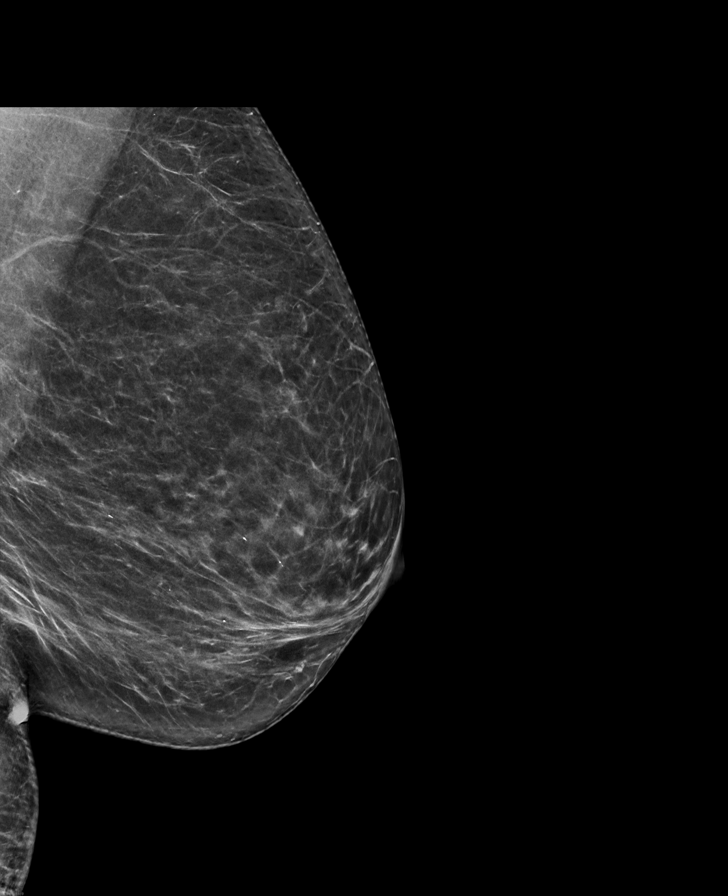

[R MLO synth-2D]
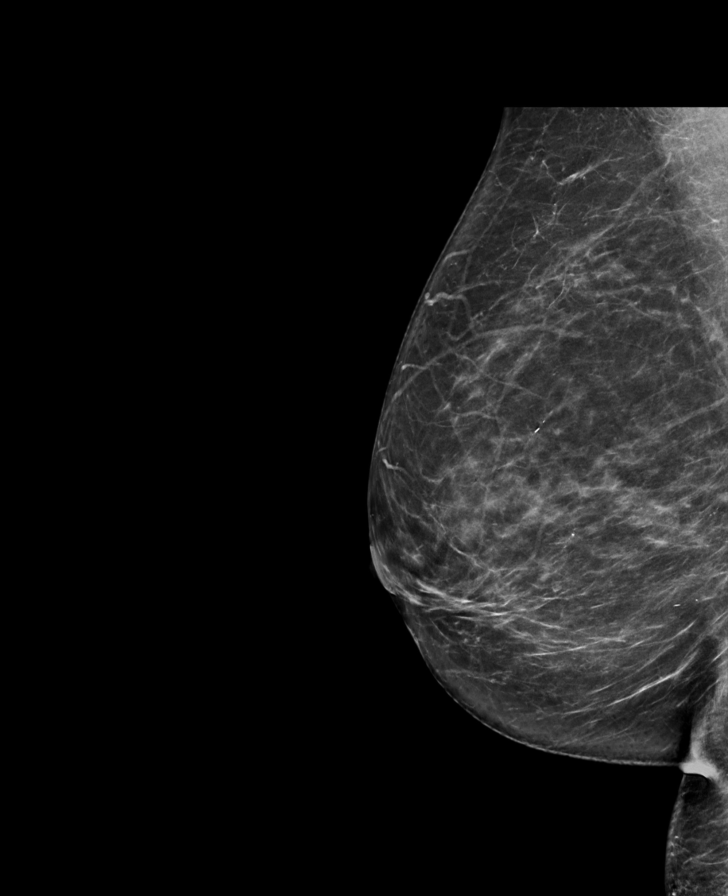

[R CC tomo · tomo slice 35/70.0]
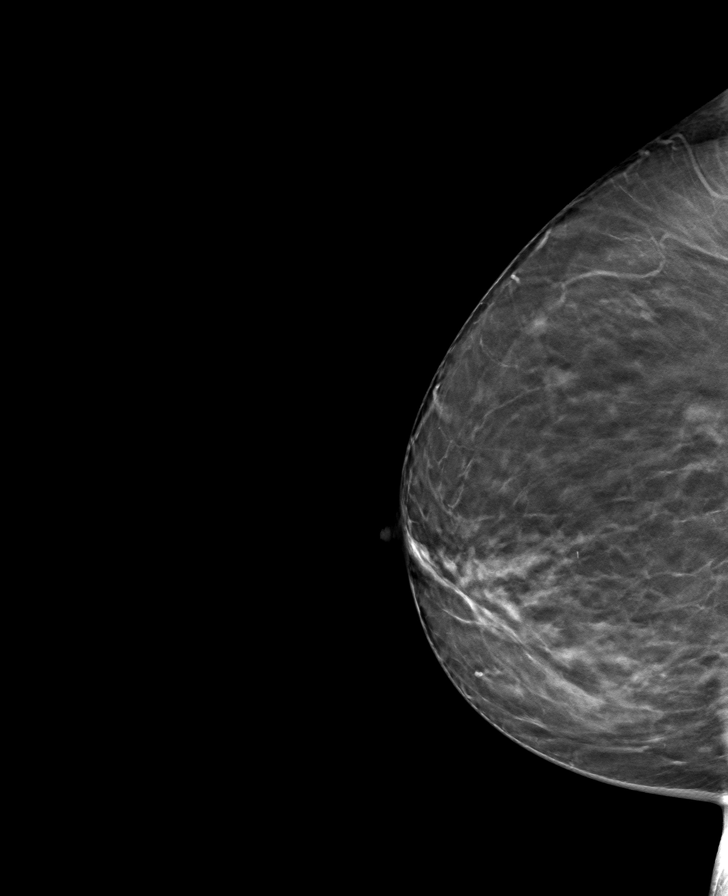

[L CC tomo · tomo slice 34/67.0]
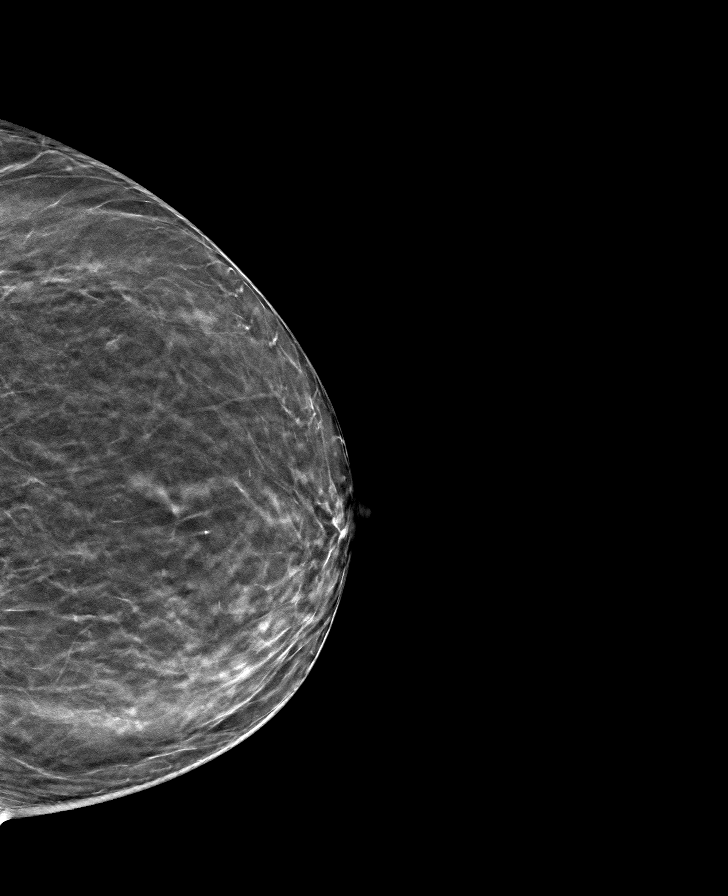

[L MLO tomo · tomo slice 37/73.0]
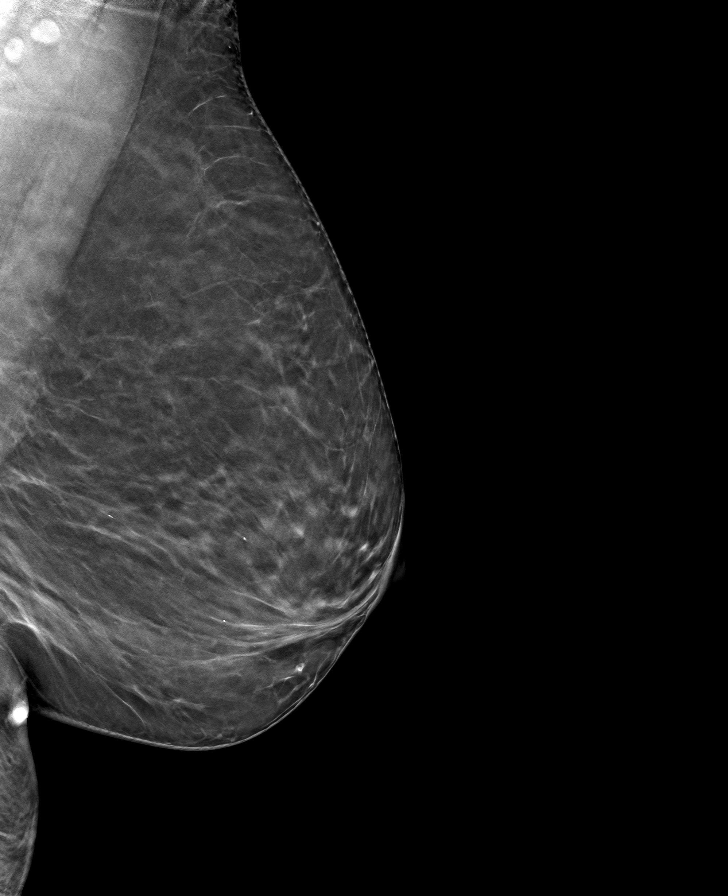

[R MLO tomo · tomo slice 39/76.0]
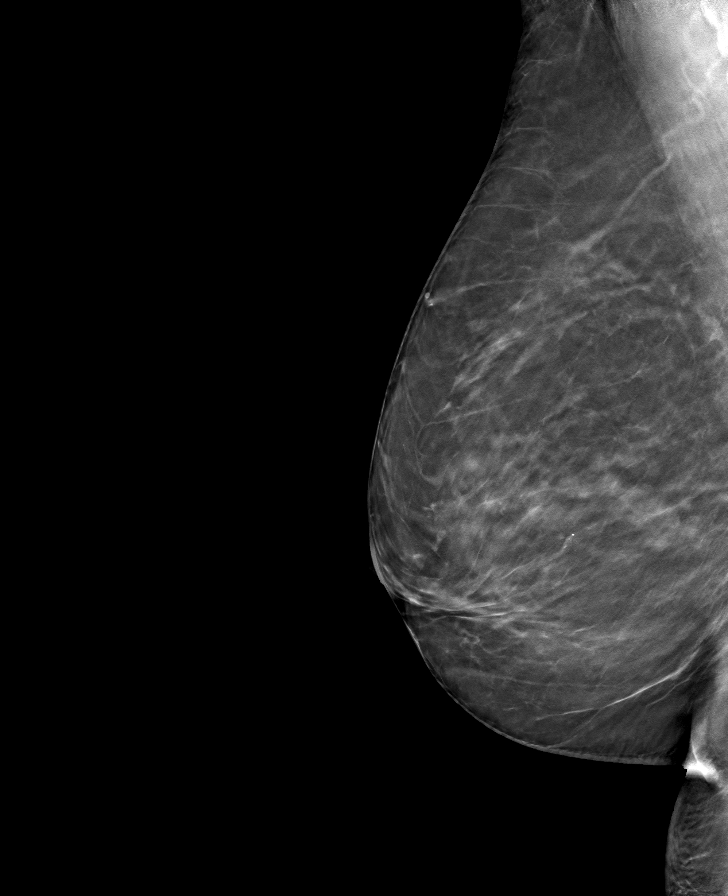

[8 of 24 positions shown; findings below may reference images not displayed]

ACR Breast Density Category b: There are scattered areas of
fibroglandular density.
FINDINGS: There are no findings suspicious for malignancy.
IMPRESSION: No mammographic evidence of malignancy. A result letter of this
screening mammogram will be mailed directly to the patient.

RECOMMENDATION:
Screening mammogram in one year. (Code:51-O-LD2)

BI-RADS CATEGORY  1: Negative.

## 2022-11-01 DIAGNOSIS — R7303 Prediabetes: Secondary | ICD-10-CM | POA: Diagnosis not present

## 2022-11-01 DIAGNOSIS — I10 Essential (primary) hypertension: Secondary | ICD-10-CM | POA: Diagnosis not present

## 2022-11-28 DIAGNOSIS — H401433 Capsular glaucoma with pseudoexfoliation of lens, bilateral, severe stage: Secondary | ICD-10-CM | POA: Diagnosis not present

## 2022-12-16 DIAGNOSIS — E785 Hyperlipidemia, unspecified: Secondary | ICD-10-CM | POA: Diagnosis not present

## 2022-12-16 DIAGNOSIS — M1990 Unspecified osteoarthritis, unspecified site: Secondary | ICD-10-CM | POA: Diagnosis not present

## 2022-12-16 DIAGNOSIS — R32 Unspecified urinary incontinence: Secondary | ICD-10-CM | POA: Diagnosis not present

## 2022-12-16 DIAGNOSIS — M810 Age-related osteoporosis without current pathological fracture: Secondary | ICD-10-CM | POA: Diagnosis not present

## 2022-12-16 DIAGNOSIS — I2510 Atherosclerotic heart disease of native coronary artery without angina pectoris: Secondary | ICD-10-CM | POA: Diagnosis not present

## 2022-12-16 DIAGNOSIS — J45909 Unspecified asthma, uncomplicated: Secondary | ICD-10-CM | POA: Diagnosis not present

## 2022-12-16 DIAGNOSIS — G4733 Obstructive sleep apnea (adult) (pediatric): Secondary | ICD-10-CM | POA: Diagnosis not present

## 2022-12-16 DIAGNOSIS — I129 Hypertensive chronic kidney disease with stage 1 through stage 4 chronic kidney disease, or unspecified chronic kidney disease: Secondary | ICD-10-CM | POA: Diagnosis not present

## 2022-12-16 DIAGNOSIS — R7303 Prediabetes: Secondary | ICD-10-CM | POA: Diagnosis not present

## 2023-01-17 ENCOUNTER — Other Ambulatory Visit: Payer: Self-pay | Admitting: Gastroenterology

## 2023-01-17 DIAGNOSIS — Z09 Encounter for follow-up examination after completed treatment for conditions other than malignant neoplasm: Secondary | ICD-10-CM | POA: Diagnosis not present

## 2023-01-17 DIAGNOSIS — Z9889 Other specified postprocedural states: Secondary | ICD-10-CM | POA: Diagnosis not present

## 2023-01-17 DIAGNOSIS — K5730 Diverticulosis of large intestine without perforation or abscess without bleeding: Secondary | ICD-10-CM | POA: Diagnosis not present

## 2023-01-17 DIAGNOSIS — Z860101 Personal history of adenomatous and serrated colon polyps: Secondary | ICD-10-CM | POA: Diagnosis not present

## 2023-01-17 DIAGNOSIS — K639 Disease of intestine, unspecified: Secondary | ICD-10-CM

## 2023-01-17 DIAGNOSIS — K6389 Other specified diseases of intestine: Secondary | ICD-10-CM | POA: Diagnosis not present

## 2023-01-29 DIAGNOSIS — Z0000 Encounter for general adult medical examination without abnormal findings: Secondary | ICD-10-CM | POA: Diagnosis not present

## 2023-01-29 DIAGNOSIS — I10 Essential (primary) hypertension: Secondary | ICD-10-CM | POA: Diagnosis not present

## 2023-01-29 DIAGNOSIS — M810 Age-related osteoporosis without current pathological fracture: Secondary | ICD-10-CM | POA: Diagnosis not present

## 2023-01-29 DIAGNOSIS — H4089 Other specified glaucoma: Secondary | ICD-10-CM | POA: Diagnosis not present

## 2023-01-29 DIAGNOSIS — I739 Peripheral vascular disease, unspecified: Secondary | ICD-10-CM | POA: Diagnosis not present

## 2023-01-29 DIAGNOSIS — Z23 Encounter for immunization: Secondary | ICD-10-CM | POA: Diagnosis not present

## 2023-01-29 DIAGNOSIS — E7800 Pure hypercholesterolemia, unspecified: Secondary | ICD-10-CM | POA: Diagnosis not present

## 2023-01-29 DIAGNOSIS — G4733 Obstructive sleep apnea (adult) (pediatric): Secondary | ICD-10-CM | POA: Diagnosis not present

## 2023-01-29 DIAGNOSIS — J45909 Unspecified asthma, uncomplicated: Secondary | ICD-10-CM | POA: Diagnosis not present

## 2023-01-31 ENCOUNTER — Other Ambulatory Visit: Payer: Self-pay | Admitting: Internal Medicine

## 2023-01-31 DIAGNOSIS — M810 Age-related osteoporosis without current pathological fracture: Secondary | ICD-10-CM

## 2023-02-06 ENCOUNTER — Encounter: Payer: Self-pay | Admitting: Gastroenterology

## 2023-02-11 ENCOUNTER — Ambulatory Visit
Admission: RE | Admit: 2023-02-11 | Discharge: 2023-02-11 | Disposition: A | Discharge status: Home / Self Care | Payer: Medicare PPO | Source: Ambulatory Visit | Attending: Gastroenterology | Admitting: Gastroenterology

## 2023-02-11 DIAGNOSIS — K6389 Other specified diseases of intestine: Secondary | ICD-10-CM | POA: Diagnosis not present

## 2023-02-11 DIAGNOSIS — K639 Disease of intestine, unspecified: Secondary | ICD-10-CM

## 2023-02-11 MED ORDER — IOPAMIDOL (ISOVUE-300) INJECTION 61%
100.0000 mL | Freq: Once | INTRAVENOUS | Status: AC | PRN
  Administered 2023-02-11: 100 mL via INTRAVENOUS

## 2023-03-18 ENCOUNTER — Encounter: Payer: Self-pay | Admitting: Internal Medicine

## 2023-03-18 DIAGNOSIS — I700 Atherosclerosis of aorta: Secondary | ICD-10-CM | POA: Diagnosis not present

## 2023-03-18 DIAGNOSIS — I739 Peripheral vascular disease, unspecified: Secondary | ICD-10-CM | POA: Diagnosis not present

## 2023-03-18 DIAGNOSIS — I10 Essential (primary) hypertension: Secondary | ICD-10-CM | POA: Diagnosis not present

## 2023-03-18 DIAGNOSIS — N950 Postmenopausal bleeding: Secondary | ICD-10-CM | POA: Diagnosis not present

## 2023-03-20 ENCOUNTER — Other Ambulatory Visit: Payer: Self-pay | Admitting: Internal Medicine

## 2023-03-20 DIAGNOSIS — N950 Postmenopausal bleeding: Secondary | ICD-10-CM

## 2023-03-21 ENCOUNTER — Ambulatory Visit
Admission: RE | Admit: 2023-03-21 | Discharge: 2023-03-21 | Disposition: A | Discharge status: Home / Self Care | Payer: Medicare PPO | Source: Ambulatory Visit | Attending: Internal Medicine | Admitting: Internal Medicine

## 2023-03-21 DIAGNOSIS — D259 Leiomyoma of uterus, unspecified: Secondary | ICD-10-CM | POA: Diagnosis not present

## 2023-03-21 DIAGNOSIS — N950 Postmenopausal bleeding: Secondary | ICD-10-CM | POA: Diagnosis not present

## 2023-04-04 DIAGNOSIS — N858 Other specified noninflammatory disorders of uterus: Secondary | ICD-10-CM | POA: Diagnosis not present

## 2023-04-04 DIAGNOSIS — Q505 Embryonic cyst of broad ligament: Secondary | ICD-10-CM | POA: Diagnosis not present

## 2023-04-04 DIAGNOSIS — N950 Postmenopausal bleeding: Secondary | ICD-10-CM | POA: Diagnosis not present

## 2023-04-04 DIAGNOSIS — N840 Polyp of corpus uteri: Secondary | ICD-10-CM | POA: Diagnosis not present

## 2023-05-18 ENCOUNTER — Ambulatory Visit (HOSPITAL_COMMUNITY)
Admission: EM | Admit: 2023-05-18 | Discharge: 2023-05-18 | Disposition: A | Discharge status: Home / Self Care | Attending: Internal Medicine | Admitting: Internal Medicine

## 2023-05-18 ENCOUNTER — Ambulatory Visit (INDEPENDENT_AMBULATORY_CARE_PROVIDER_SITE_OTHER)

## 2023-05-18 ENCOUNTER — Other Ambulatory Visit: Payer: Self-pay

## 2023-05-18 ENCOUNTER — Encounter (HOSPITAL_COMMUNITY): Payer: Self-pay | Admitting: Emergency Medicine

## 2023-05-18 DIAGNOSIS — M25561 Pain in right knee: Secondary | ICD-10-CM | POA: Diagnosis not present

## 2023-05-18 DIAGNOSIS — M25461 Effusion, right knee: Secondary | ICD-10-CM | POA: Diagnosis not present

## 2023-05-18 DIAGNOSIS — M1711 Unilateral primary osteoarthritis, right knee: Secondary | ICD-10-CM | POA: Diagnosis not present

## 2023-05-18 NOTE — ED Triage Notes (Signed)
 Pt reports she fell yesterday on her yard and today her right knee is swollen and painful.

## 2023-05-18 NOTE — Discharge Instructions (Addendum)
 X-ray of the right knee done today due to the pain and swelling.  Final evaluation by the radiologist is still pending but given the physical exam findings, the amount of pain and the swelling present we have concern for possible ligament injury.  Given this we will place you in a knee immobilizer and wear this day and night.  Use a walker for ambulation.  May remove the knee immobilizer to shower but make sure that someone is with you or knows that you are showering in case there is any issues.  Recommend following up with the orthopedic urgent care on Monday for further evaluation to ensure there is not a ligament injury.  May use Tylenol  for pain.  Ice the knee 2-3 times daily for 10 to 15 minutes but make sure to use a cloth over the knee and not apply ice directly to the skin.  May remove the knee immobilizer when icing.

## 2023-05-18 NOTE — ED Provider Notes (Signed)
 [facility]    CSN: [id number] Arrival date & time: 05/18/23  1131      History   Chief Complaint Chief Complaint  Patient presents with   Fall   Knee Injury    HPI Kimberly Garza is a 78 y.o. female.   78 year old female who presents to urgent care with complaints of right knee pain and swelling.  Yesterday she was working in her yard on her sun porch and she fell in a hole where her water hose is supposed to be.  This was with the right leg.  Her leg did twist when she fell.  It immediately began to swell and she was in pain.  She was able to walk some but only with significant discomfort.  She put ice on the knee and the swelling has gotten somewhat better but it remains swollen today.  She continues to have pain with ambulation.  She denies any history of knee surgery, knee injuries in the past or arthritis in the knees.  She denies any other injuries.   Fall Pertinent negatives include no chest pain, no abdominal pain and no shortness of breath.    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      Home Medications    Prior to Admission medications   Medication Sig Start Date End Date Taking? Authorizing Provider  acetaminophen  (TYLENOL ) 500 MG tablet Take 1,000 mg by mouth every 6 (six) hours as needed for mild pain, moderate pain, fever or headache.    [provider]  albuterol  (PROVENTIL  HFA;VENTOLIN  HFA) 108 (90 Base) MCG/ACT inhaler Inhale 2 puffs  into the lungs every 4 (four) hours as needed for wheezing or shortness of breath. 02/09/18   Jerri Morale A, FNP  amLODipine  (NORVASC ) 5 MG tablet Take 1 tablet (5 mg total) by mouth daily. 07/03/18   Adolph Hoop, PA-C  bimatoprost (LUMIGAN) 0.03 % ophthalmic solution Place 1 drop into both eyes at bedtime.    [provider]  brimonidine (ALPHAGAN) 0.15 % ophthalmic solution Place 1 drop into both eyes 3 (three) times daily.     [provider]  brinzolamide (AZOPT) 1 % ophthalmic suspension Place 1 drop into both eyes 2 (two) times daily.    [provider]  Cholecalciferol (VITAMIN D-1000 MAX ST) 1000 units tablet Take 1,000 Units by mouth daily.    [provider]  ELDERBERRY PO Take by mouth.    [provider]  Flaxseed, Linseed, (FLAXSEED OIL PO) Take 1 tablet by mouth 2 (two) times daily.    [provider]  fluticasone  (FLOVENT  HFA) 110 MCG/ACT inhaler ONE PUFF TWICE A DAY TO PREVENT COUGH OR WHEEZE. RINSE, GARGLE AND SPIT AFTER USEL 07/19/15   Jule Nyhan, MD  meclizine  (ANTIVERT ) 12.5 MG tablet Take 1 tablet (12.5 mg total) by mouth 3 (  three) times daily as needed for dizziness. 07/03/18   Adolph Hoop, PA-C  montelukast  (SINGULAIR ) 10 MG tablet Take 1 tablet (10 mg total) by mouth at bedtime. 07/03/18   Adolph Hoop, PA-C  Omega-3 Fatty Acids (FISH OIL PO) Take 1 capsule by mouth daily.    [provider]    Family History Family History  Problem Relation Age of Onset   Heart failure Father     Social History Social History   Tobacco Use   Smoking status: Former    Current packs/day: 0.00    Average packs/day: 0.1 packs/day for 5.0 years (0.5 ttl pk-yrs)    Types: Cigarettes    Start date: 08/21/1963    Quit date: 08/20/1968    Years since quitting: 54.7   Smokeless tobacco: Never  Vaping Use   Vaping status: Never Used  Substance Use Topics   Alcohol use: No   Drug use: No     Allergies   Shrimp [shellfish  allergy], Penicillins, and Levaquin [levofloxacin in d5w]   Review of Systems Review of Systems  Constitutional:  Negative for chills and fever.  HENT:  Negative for ear pain and sore throat.   Eyes:  Negative for pain and visual disturbance.  Respiratory:  Negative for cough and shortness of breath.   Cardiovascular:  Negative for chest pain and palpitations.  Gastrointestinal:  Negative for abdominal pain and vomiting.  Genitourinary:  Negative for dysuria and hematuria.  Musculoskeletal:  Negative for arthralgias and back pain.       Right knee pain and swelling  Skin:  Negative for color change and rash.  Neurological:  Negative for seizures and syncope.  All other systems reviewed and are negative.    Physical Exam Triage Vital Signs ED Triage Vitals  Encounter Vitals Group     BP 05/18/23 1214 116/70     Systolic BP Percentile --      Diastolic BP Percentile --      Pulse Rate 05/18/23 1214 71     Resp 05/18/23 1214 18     Temp 05/18/23 1214 98.5 F (36.9 C)     Temp Source 05/18/23 1214 Oral     SpO2 05/18/23 1214 98 %     Weight --      Height --      Head Circumference --      Peak Flow --      Pain Score 05/18/23 1215 7     Pain Loc --      Pain Education --      Exclude from Growth Chart --    No data found.  Updated Vital Signs BP 116/70 (BP Location: Right Arm)   Pulse 71   Temp 98.5 F (36.9 C) (Oral)   Resp 18   SpO2 98%   Visual Acuity Right Eye Distance:   Left Eye Distance:   Bilateral Distance:    Right Eye Near:   Left Eye Near:    Bilateral Near:     Physical Exam Vitals and nursing note reviewed.  Constitutional:      General: She is not in acute distress.    Appearance: She is well-developed.  HENT:     Head: Normocephalic and atraumatic.  Eyes:     Conjunctiva/sclera: Conjunctivae normal.  Cardiovascular:     Rate and Rhythm: Normal rate and regular rhythm.     Heart sounds: No murmur heard. Pulmonary:     Effort:  Pulmonary effort is normal. No  respiratory distress.     Breath sounds: Normal breath sounds.  Abdominal:     Palpations: Abdomen is soft.     Tenderness: There is no abdominal tenderness.  Musculoskeletal:        General: No swelling.     Cervical back: Neck supple.     Right knee: Swelling present. Decreased range of motion. Tenderness present over the medial joint line and ACL. ACL laxity present. Normal pulse.     Instability Tests: Anterior drawer test positive. Medial McMurray test negative and lateral McMurray test negative.     Right ankle: Normal.     Left ankle: Normal.  Skin:    General: Skin is warm and dry.     Capillary Refill: Capillary refill takes less than 2 seconds.  Neurological:     Mental Status: She is alert.  Psychiatric:        Mood and Affect: Mood normal.     UC Treatments / Results  Labs (all labs ordered are listed, but only abnormal results are displayed) Labs Reviewed - No data to display  EKG   Radiology No results found.  Procedures Procedures (including critical care time)  Medications Ordered in UC Medications - No data to display  Initial Impression / Assessment and Plan / UC Course  I have reviewed the triage vital signs and the nursing notes.  Pertinent labs & imaging results that were available during my care of the patient were reviewed by me and considered in my medical decision making (see chart for details).     Acute pain of right knee - Plan: DG Knee Complete 4 Views Right, DG Knee Complete 4 Views Right   X-ray of the right knee done today due to the pain and swelling.  Final evaluation by the radiologist is still pending but given the physical exam findings, the amount of pain and the swelling present we have concern for possible ligament injury.  Given this we will place you in a knee immobilizer and wear this day and night.  Use a walker for ambulation.  May remove the knee immobilizer to shower but make sure that  someone is with you or knows that you are showering in case there is any issues.  Recommend following up with the orthopedic urgent care on Monday for further evaluation to ensure there is not a ligament injury.  May use Tylenol  for pain.  Ice the knee 2-3 times daily for 10 to 15 minutes but make sure to use a cloth over the knee and not apply ice directly to the skin.  May remove the knee immobilizer when icing.  Final Clinical Impressions(s) / UC Diagnoses   Final diagnoses:  Acute pain of right knee   Discharge Instructions   None    ED Prescriptions   None    PDMP not reviewed this encounter.   Kreg Pesa, [place] 05/18/23 1346

## 2023-05-19 ENCOUNTER — Encounter: Payer: Self-pay | Admitting: Internal Medicine

## 2023-05-19 ENCOUNTER — Ambulatory Visit (HOSPITAL_COMMUNITY)

## 2023-06-13 DIAGNOSIS — Q505 Embryonic cyst of broad ligament: Secondary | ICD-10-CM | POA: Diagnosis not present

## 2023-06-13 DIAGNOSIS — N83202 Unspecified ovarian cyst, left side: Secondary | ICD-10-CM | POA: Diagnosis not present

## 2023-06-13 DIAGNOSIS — H401493 Capsular glaucoma with pseudoexfoliation of lens, unspecified eye, severe stage: Secondary | ICD-10-CM | POA: Diagnosis not present

## 2023-06-13 DIAGNOSIS — N950 Postmenopausal bleeding: Secondary | ICD-10-CM | POA: Diagnosis not present

## 2023-06-21 DIAGNOSIS — Q505 Embryonic cyst of broad ligament: Secondary | ICD-10-CM | POA: Diagnosis not present

## 2023-07-09 ENCOUNTER — Other Ambulatory Visit: Payer: Self-pay | Admitting: Internal Medicine

## 2023-07-09 DIAGNOSIS — Z1231 Encounter for screening mammogram for malignant neoplasm of breast: Secondary | ICD-10-CM

## 2023-08-07 DIAGNOSIS — H401493 Capsular glaucoma with pseudoexfoliation of lens, unspecified eye, severe stage: Secondary | ICD-10-CM | POA: Diagnosis not present

## 2023-08-16 ENCOUNTER — Encounter: Payer: Self-pay | Admitting: Advanced Practice Midwife

## 2023-08-19 ENCOUNTER — Ambulatory Visit

## 2023-08-20 ENCOUNTER — Inpatient Hospital Stay
Admission: RE | Admit: 2023-08-20 | Discharge: 2023-08-20 | Discharge status: Home / Self Care | Source: Ambulatory Visit | Attending: Internal Medicine

## 2023-08-20 DIAGNOSIS — Z1231 Encounter for screening mammogram for malignant neoplasm of breast: Secondary | ICD-10-CM

## 2023-09-24 ENCOUNTER — Other Ambulatory Visit: Payer: Medicare PPO

## 2023-10-09 DIAGNOSIS — N949 Unspecified condition associated with female genital organs and menstrual cycle: Secondary | ICD-10-CM | POA: Diagnosis not present

## 2023-10-09 DIAGNOSIS — J309 Allergic rhinitis, unspecified: Secondary | ICD-10-CM | POA: Diagnosis not present

## 2023-10-09 DIAGNOSIS — I10 Essential (primary) hypertension: Secondary | ICD-10-CM | POA: Diagnosis not present

## 2023-10-09 DIAGNOSIS — H409 Unspecified glaucoma: Secondary | ICD-10-CM | POA: Diagnosis not present

## 2023-10-12 ENCOUNTER — Encounter (HOSPITAL_COMMUNITY): Payer: Self-pay

## 2023-10-12 ENCOUNTER — Emergency Department (HOSPITAL_COMMUNITY)
Admission: EM | Admit: 2023-10-12 | Discharge: 2023-10-12 | Disposition: A | Discharge status: Home / Self Care | Attending: Emergency Medicine | Admitting: Emergency Medicine

## 2023-10-12 DIAGNOSIS — R059 Cough, unspecified: Secondary | ICD-10-CM | POA: Diagnosis present

## 2023-10-12 DIAGNOSIS — U071 COVID-19: Secondary | ICD-10-CM | POA: Diagnosis not present

## 2023-10-12 LAB — RESP PANEL BY RT-PCR (RSV, FLU A&B, COVID)  RVPGX2
Influenza A by PCR: NEGATIVE
Influenza B by PCR: NEGATIVE
Resp Syncytial Virus by PCR: NEGATIVE
SARS Coronavirus 2 by RT PCR: POSITIVE — AB

## 2023-10-12 NOTE — ED Provider Notes (Signed)
 [facility] Emergency Department Provider Note MRN:  [id number]  Arrival date & time: 10/12/23     Chief Complaint   No chief complaint on file.   History of Present Illness   Kimberly Garza is a 78 y.o. year-old female presents to the ED with chief complaint of cough, generalized body aches.  States that she had trouble sleeping last night.  States that she feels ok now.  Reports that she had been having cold chills.  She states that she has had slight headache as well.  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  History provided by patient.   Review of Systems  Pertinent positive and negative review of systems noted in HPI.    Physical Exam   Vitals:   10/12/23 2003 10/12/23 2047  BP: (!) 168/93   Pulse: 78   Resp: 16   Temp: 98.3 F (36.8 C)   SpO2: 100% 100%    CONSTITUTIONAL:  non toxic-appearing, NAD NEURO:  Alert and oriented x 3, CN 3-12 grossly intact EYES:  eyes equal and reactive ENT/NECK:  Supple, no stridor  CARDIO:  normal rate, regular rhythm, appears well-perfused  PULM:  No respiratory distress, CTAB GI/GU:  non-distended,  MSK/SPINE:  No gross deformities, no edema, moves all extremities  SKIN:  no rash, atraumatic   *Additional and/or pertinent findings included in MDM below  Diagnostic and Interventional Summary    EKG Interpretation Date/Time:    Ventricular Rate:    PR Interval:    QRS Duration:    QT Interval:    QTC Calculation:   R Axis:      Text Interpretation:         Labs Reviewed  RESP PANEL BY RT-PCR (RSV, FLU A&B, COVID)  [id number] - Abnormal; Notable for the following components:      Result Value   SARS Coronavirus 2 by RT PCR POSITIVE (*)    All other components within normal limits    No orders to display    Medications - No data to display   Procedures  /  Critical Care Procedures  ED Course and Medical Decision Making  I have reviewed  the triage vital signs, the nursing notes, and pertinent available records from the EMR.  Social Determinants Affecting Complexity of Care: Patient has no clinically significant social determinants affecting this chief complaint..   ED Course:    Medical Decision Making Patient is a otherwise very healthy 78 year old female with flulike symptoms.  She states the symptoms just started yesterday.  She tested positive for COVID today.  She states that she feels pretty good right now.  Vital signs notable for mild hypertension, but afebrile, normal O2 sat, not tachycardic.  She is in no respiratory distress.  She looks well.  We discussed treatment options, patient states that she does not like to take medications and does not prefer any treatment at this time.  She states that she just wanted to be tested.         Consultants: No consultations were needed in caring for this patient.   Treatment and Plan: Emergency department workup does not suggest an emergent condition requiring admission or immediate intervention beyond  what has been performed at this time. The patient is safe for discharge and has  been instructed to return immediately for worsening symptoms, change in  symptoms or any other concerns    Final Clinical Impressions(s) / ED  Diagnoses     ICD-10-CM   1. COVID-19  U07.1       ED Discharge Orders     None         Discharge Instructions Discussed with and Provided to Patient:   Discharge Instructions   None      Vicky Charleston, PA-C 10/12/23 2220    Patsey Lot, MD 10/12/23 2256

## 2023-10-12 NOTE — ED Triage Notes (Signed)
 Pt coming from home, with flu like symptoms, headache, runny nose, chills, general discomfort, and lower back pain.  Took tylenol  650mg  at 1800.   Pain 8 out 10, alert and oriented in triage

## 2023-10-25 ENCOUNTER — Telehealth: Payer: Self-pay | Admitting: *Deleted

## 2023-10-25 NOTE — Telephone Encounter (Signed)
 Spoke with the patient regarding the referral to GYN oncology. Patient scheduled as new patient with Dr Eldonna on 10/13 at 9 am. Patient given an arrival time of 8:30 am.  Explained to the patient the the doctor will perform a pelvic exam at this visit. Patient given the policy that only one visitor allowed and that visitor must be over 16 yrs are allowed in the Cancer Center. Patient given the address/phone number for the clinic and that the center offers free valet service. Patient aware that masks optional.

## 2023-10-25 NOTE — Telephone Encounter (Signed)
 Kimberly Garza called to reschedule her new patient appointment with Dr.Newton on 10/13.   Rescheduled to 10/20 with Dr.Newton. Pt agreed to date/time

## 2023-10-29 ENCOUNTER — Telehealth: Payer: Self-pay | Admitting: *Deleted

## 2023-10-29 NOTE — Telephone Encounter (Signed)
 Returned the patient's call and LMOM. Explained that there is no opening for a new patient appt on 10/20 between 1250 at 230. Explained that the office has lunch from 12-1 and then starts follow up appts at 1 pm. Ask the patient to call the office back if she needs to move her appt.

## 2023-10-30 NOTE — Telephone Encounter (Signed)
 Patient called back and moved appt from 10/20 to 10/27 per her schedule

## 2023-11-11 ENCOUNTER — Ambulatory Visit: Admitting: Psychiatry

## 2023-11-13 ENCOUNTER — Other Ambulatory Visit (HOSPITAL_BASED_OUTPATIENT_CLINIC_OR_DEPARTMENT_OTHER)

## 2023-11-18 ENCOUNTER — Inpatient Hospital Stay: Admitting: Psychiatry

## 2023-11-25 ENCOUNTER — Telehealth: Payer: Self-pay

## 2023-11-25 ENCOUNTER — Inpatient Hospital Stay: Admitting: Psychiatry

## 2023-11-25 DIAGNOSIS — N9489 Other specified conditions associated with female genital organs and menstrual cycle: Secondary | ICD-10-CM

## 2023-11-25 NOTE — Telephone Encounter (Signed)
 I reached out to Ms.Elpidio regarding her missed appointment with Dr.Newton this morning at 9:00.   Pt states she had a class come up and couldn't make it. (States she called on Friday 10/24 to cancel,)  Pt states she will call back and reschedule once her schedule has been figured out.  Direct office number given to patient.   Appointment cancelled at this time and referring office notified of several appointment reschedules.

## 2023-11-25 NOTE — Progress Notes (Unsigned)
This encounter was created in error - please disregard.no show

## 2023-11-26 ENCOUNTER — Telehealth: Payer: Self-pay | Admitting: *Deleted

## 2023-11-26 ENCOUNTER — Telehealth: Payer: Self-pay

## 2023-11-26 NOTE — Telephone Encounter (Signed)
 Kimberly Garza called the office stating she would like to reschedule her missed appointment with Dr.Newton on 10/27.   She is scheduled for 11/17 @ 9:00. Pt declined earlier appointment dates d/t work schedule.

## 2023-12-04 DIAGNOSIS — H4423 Degenerative myopia, bilateral: Secondary | ICD-10-CM | POA: Diagnosis not present

## 2023-12-04 DIAGNOSIS — H2512 Age-related nuclear cataract, left eye: Secondary | ICD-10-CM | POA: Diagnosis not present

## 2023-12-04 DIAGNOSIS — M810 Age-related osteoporosis without current pathological fracture: Secondary | ICD-10-CM | POA: Diagnosis not present

## 2023-12-04 DIAGNOSIS — Z0000 Encounter for general adult medical examination without abnormal findings: Secondary | ICD-10-CM | POA: Diagnosis not present

## 2023-12-04 DIAGNOSIS — H1013 Acute atopic conjunctivitis, bilateral: Secondary | ICD-10-CM | POA: Diagnosis not present

## 2023-12-04 DIAGNOSIS — J432 Centrilobular emphysema: Secondary | ICD-10-CM | POA: Diagnosis not present

## 2023-12-04 DIAGNOSIS — I700 Atherosclerosis of aorta: Secondary | ICD-10-CM | POA: Diagnosis not present

## 2023-12-04 DIAGNOSIS — H401493 Capsular glaucoma with pseudoexfoliation of lens, unspecified eye, severe stage: Secondary | ICD-10-CM | POA: Diagnosis not present

## 2023-12-09 DIAGNOSIS — H401433 Capsular glaucoma with pseudoexfoliation of lens, bilateral, severe stage: Secondary | ICD-10-CM | POA: Diagnosis not present

## 2023-12-10 DIAGNOSIS — J45909 Unspecified asthma, uncomplicated: Secondary | ICD-10-CM | POA: Diagnosis not present

## 2023-12-10 DIAGNOSIS — I129 Hypertensive chronic kidney disease with stage 1 through stage 4 chronic kidney disease, or unspecified chronic kidney disease: Secondary | ICD-10-CM | POA: Diagnosis not present

## 2023-12-10 DIAGNOSIS — E785 Hyperlipidemia, unspecified: Secondary | ICD-10-CM | POA: Diagnosis not present

## 2023-12-10 DIAGNOSIS — I2510 Atherosclerotic heart disease of native coronary artery without angina pectoris: Secondary | ICD-10-CM | POA: Diagnosis not present

## 2023-12-10 DIAGNOSIS — I70209 Unspecified atherosclerosis of native arteries of extremities, unspecified extremity: Secondary | ICD-10-CM | POA: Diagnosis not present

## 2023-12-10 DIAGNOSIS — H409 Unspecified glaucoma: Secondary | ICD-10-CM | POA: Diagnosis not present

## 2023-12-10 DIAGNOSIS — N189 Chronic kidney disease, unspecified: Secondary | ICD-10-CM | POA: Diagnosis not present

## 2023-12-10 DIAGNOSIS — I700 Atherosclerosis of aorta: Secondary | ICD-10-CM | POA: Diagnosis not present

## 2023-12-10 DIAGNOSIS — M810 Age-related osteoporosis without current pathological fracture: Secondary | ICD-10-CM | POA: Diagnosis not present

## 2023-12-12 ENCOUNTER — Encounter: Payer: Self-pay | Admitting: Psychiatry

## 2023-12-12 DIAGNOSIS — Z965 Presence of tooth-root and mandibular implants: Secondary | ICD-10-CM | POA: Insufficient documentation

## 2023-12-16 ENCOUNTER — Encounter: Payer: Self-pay | Admitting: Psychiatry

## 2023-12-16 ENCOUNTER — Inpatient Hospital Stay: Attending: Psychiatry | Admitting: Psychiatry

## 2023-12-16 ENCOUNTER — Inpatient Hospital Stay

## 2023-12-16 VITALS — BP 132/61 | HR 83 | Temp 97.7°F | Resp 19 | Ht 66.0 in | Wt 152.4 lb

## 2023-12-16 DIAGNOSIS — Z807 Family history of other malignant neoplasms of lymphoid, hematopoietic and related tissues: Secondary | ICD-10-CM | POA: Insufficient documentation

## 2023-12-16 DIAGNOSIS — R9389 Abnormal findings on diagnostic imaging of other specified body structures: Secondary | ICD-10-CM | POA: Insufficient documentation

## 2023-12-16 DIAGNOSIS — R978 Other abnormal tumor markers: Secondary | ICD-10-CM | POA: Insufficient documentation

## 2023-12-16 DIAGNOSIS — H409 Unspecified glaucoma: Secondary | ICD-10-CM | POA: Diagnosis not present

## 2023-12-16 DIAGNOSIS — Z87891 Personal history of nicotine dependence: Secondary | ICD-10-CM | POA: Insufficient documentation

## 2023-12-16 DIAGNOSIS — J45909 Unspecified asthma, uncomplicated: Secondary | ICD-10-CM | POA: Diagnosis not present

## 2023-12-16 DIAGNOSIS — Z79899 Other long term (current) drug therapy: Secondary | ICD-10-CM | POA: Diagnosis not present

## 2023-12-16 DIAGNOSIS — I10 Essential (primary) hypertension: Secondary | ICD-10-CM | POA: Insufficient documentation

## 2023-12-16 DIAGNOSIS — Z965 Presence of tooth-root and mandibular implants: Secondary | ICD-10-CM | POA: Insufficient documentation

## 2023-12-16 DIAGNOSIS — Z7951 Long term (current) use of inhaled steroids: Secondary | ICD-10-CM | POA: Insufficient documentation

## 2023-12-16 DIAGNOSIS — Z803 Family history of malignant neoplasm of breast: Secondary | ICD-10-CM | POA: Insufficient documentation

## 2023-12-16 DIAGNOSIS — N9489 Other specified conditions associated with female genital organs and menstrual cycle: Secondary | ICD-10-CM

## 2023-12-16 DIAGNOSIS — E7800 Pure hypercholesterolemia, unspecified: Secondary | ICD-10-CM | POA: Diagnosis not present

## 2023-12-16 DIAGNOSIS — N841 Polyp of cervix uteri: Secondary | ICD-10-CM | POA: Insufficient documentation

## 2023-12-16 DIAGNOSIS — R1900 Intra-abdominal and pelvic swelling, mass and lump, unspecified site: Secondary | ICD-10-CM | POA: Diagnosis not present

## 2023-12-16 LAB — CEA (ACCESS): CEA (CHCC): 2.4 ng/mL (ref 0.00–5.00)

## 2023-12-16 NOTE — Progress Notes (Signed)
 GYNECOLOGIC ONCOLOGY NEW PATIENT CONSULTATION  Date of Service: 12/16/2023 Referring Provider: Letha Savant, DO [address] [place], [address] [place],  [place] [address]   ASSESSMENT AND PLAN: Kimberly Garza is a 78 y.o. woman with 3.9 cm adnexal mass and thickened endometrium.  Reviewed workup to date.  Reviewed that the clinic ultrasounds I cannot see but I can read the reports.  I have reviewed the ultrasound from February.  Overall appearance is benign.  However given some of the changes noted on the most recent clinic ultrasound, discussed that some additional imaging would be reasonable.  Discussed MRI.  However, patient reports that she has had dental implants and may have had an issue with an MRI in the past.  Given this, recommend hospital ultrasound for more detailed evaluation and ability to review images.  Will also repeat tumor markers with the addition of a CEA, CA 19-9, and inhibin B.  Reviewed that the pelvic mass could be a benign, borderline, or malignant process.  At this time my suspicion is that this is overall benign given generally stable size and previously normal CA125.  Surgery would be the only way for definitive diagnosis but we will repeat imaging as above first to weigh the pros and cons of surgical intervention.  Additionally, patient with imaging with thickened endometrial stripe that has grown slightly over time.  Prior endometrial biopsy with endometrial polyp.  Reviewed the pros and cons of potential hysteroscopy D&C for more targeted sampling to ensure adequate evaluation.  Also reviewed option for repeat endometrial biopsy today.  If reassuring, and no repeat episodes of postmenopausal bleeding, could potentially deferred D&C.  Patient amenable to biopsy today.  Repeat biopsy collected with scant tissue removed.  Endocervical polyp noted and this was removed.  Plan for patient to get tumor markers today, pelvic ultrasound, and return to clinic after  workup is complete to review results and finalize plan.  A copy of this note was sent to the patient's referring provider.  Hoy Masters, MD Gynecologic Oncology   Medical Decision Making I personally spent  TOTAL 60 minutes face-to-face and non-face-to-face in the care of this patient, which includes all pre, intra, and post visit time on the date of service.   ------------  CC: pelvic mass  HISTORY OF PRESENT ILLNESS:  Kimberly Garza is a 78 y.o. woman who is seen in consultation at the request of Autry-Lott, Simone, DO for evaluation of pelvic mass.  Patient was evaluated 03/2023 for a few days of PMB.  She had undergone a pelvic u/s with her PCP on 03/21/23 which noted an EMS of 4 mm and possible 3 mm polyp along with possible 3.3 cm left para-ovarian cyst.  She had an EMBx on 04/04/23 which returned with benign endometrial polyp and atrophic endometrium, no hyperplasia or atypia.  She was recommended to have a repeat u/s in 8 to 12 weeks given the cyst.  Repeat u/s was performed on 06/13/23 which showed a 4.7 mm EMS and a left simple cyst measuring 3.8 x 3.4 x 2.2 cm adjacent to the left ovary with possible echogenic component measuring 6 mm.  A CA125 was collected on 06/21/23 and was normal at 8.4.  She had an additional u/s performed on 10/09/23 which noted EMS of 4.9 mm and a complex left adnexal cyst with multiple echogenic particles seen within the adjacent to the left ovary measuring 3.9 x 3.3 x 2.1 cm with echogenic component measuring 8 mm.  New echogenic  area was seen posterior wall measuring 1.8 cm.  Today pt reports that she has not had any recurrence of spotting. Denies pelvic pain. Also denies abdominal bloating, early satiety, significant weight loss, change in bowel or bladder habits.   Pt has a history of glaucoma. She follows with Dr. Victory Griffiths with [facility]. Believes she has a stent in her eye. Also reports dental implants. Reports that she had an MRI in the past where she  felt that her teeth/mouth was pulsating and had to ask that they stop the scan.    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   CERVICAL DISCECTOMY     DENTAL SURGERY     ECTOPIC PREGNANCY SURGERY  1974   EYE SURGERY     TONSILLECTOMY      OB/GYN HISTORY: OB History  Gravida Para Term Preterm AB Living  4 3 3  1 3   SAB IAB Ectopic Multiple Live Births    1  3    # Outcome Date GA Lbr Len/2nd Weight Sex Type Anes PTL Lv  4 Ectopic      ECTOPIC     3 Term      Vag-Spont   LIV  2 Term      Vag-Spont   LIV  1 Term      Vag-Spont   LIV      Age at menarche: 47 Age at menopause: 2s Hx of HRT: no Hx of STI: no Last pap: 04/19/2008 NILM History of abnormal pap smears: no  SCREENING STUDIES:  Last mammogram: 2025 Last colonoscopy: 2025  MEDICATIONS:  Current Outpatient Medications:    acetaminophen  (TYLENOL ) 500 MG tablet, Take 1,000 mg by mouth every 6 (six) hours as needed for mild pain, moderate pain, fever or headache., Disp: , Rfl:    albuterol  (PROVENTIL  HFA;VENTOLIN  HFA) 108 (90 Base) MCG/ACT inhaler, Inhale 2 puffs into the lungs every 4 (four) hours as needed for wheezing or shortness of breath., Disp: 8 g, Rfl: 1   Ashwagandha 120 MG CAPS, Take by mouth at bedtime., Disp: , Rfl:    bimatoprost (LUMIGAN) 0.03 % ophthalmic solution, Place 1 drop into both eyes at bedtime., Disp: , Rfl:    brimonidine (ALPHAGAN) 0.15 % ophthalmic solution, Place 1 drop into both eyes 3 (three) times daily. , Disp: , Rfl:    brinzolamide (AZOPT) 1 % ophthalmic suspension, Place 1 drop into both eyes 2 (two) times daily., Disp: , Rfl:    Cholecalciferol (VITAMIN D-1000 MAX ST) 1000 units tablet, Take 1,000 Units by  mouth daily., Disp: , Rfl:    ELDERBERRY PO, Take by mouth., Disp: , Rfl:    fluticasone  (FLOVENT  HFA) 110 MCG/ACT inhaler, ONE PUFF TWICE A DAY TO PREVENT COUGH OR WHEEZE. RINSE, GARGLE AND SPIT AFTER USEL, Disp: 12 g, Rfl: 5   meclizine  (ANTIVERT ) 12.5 MG tablet, Take 1 tablet (12.5 mg total) by mouth 3 (three) times daily as needed for dizziness., Disp: 30 tablet, Rfl: 0   montelukast  (SINGULAIR ) 10 MG tablet, Take 1 tablet (10 mg total) by mouth at bedtime., Disp: 30 tablet, Rfl: 3   Multiple Vitamin (MULTIVITAMIN) tablet, Take 1 tablet by mouth daily. (  Patient taking differently: Take 1 tablet by mouth daily. Daily 5), Disp: , Rfl:   ALLERGIES: Allergies  Allergen Reactions   Shrimp [Shellfish Allergy] Hives and Shortness Of Breath   Penicillins Hives and Itching    Has patient had a PCN reaction causing immediate rash, facial/tongue/throat swelling, SOB or lightheadedness with hypotension: YES Has patient had a PCN reaction causing severe rash involving mucus membranes or skin necrosis: NO  Has patient had a PCN reaction that required hospitalization: NO Has patient had a PCN reaction occurring within the last 10 years: NO If all of the above answers are NO, then may proceed with Cephalosporin use.    Levaquin [Levofloxacin In D5w] Itching    FAMILY HISTORY: Family History  Problem Relation Age of Onset   Heart failure Father    Breast cancer Daughter 68   Cancer Daughter        ?lymphoma or lung cancer, unclear   Ovarian cancer Neg Hx    Endometrial cancer Neg Hx    Colon cancer Neg Hx     SOCIAL HISTORY: Social History   Socioeconomic History   Marital status: Widowed    Spouse name: Not on file   Number of children: Not on file   Years of education: Not on file   Highest education level: Not on file  Occupational History   Not on file  Tobacco Use   Smoking status: Former    Current packs/day: 0.00    Average packs/day: 0.1 packs/day for 5.0 years (0.5 ttl  pk-yrs)    Types: Cigarettes    Start date: 08/21/1963    Quit date: 08/20/1968    Years since quitting: 55.3   Smokeless tobacco: Never  Vaping Use   Vaping status: Never Used  Substance and Sexual Activity   Alcohol use: No   Drug use: No   Sexual activity: Not Currently  Other Topics Concern   Not on file  Social History Narrative   Not on file   Social Drivers of Health   Financial Resource Strain: Not on file  Food Insecurity: Food Insecurity Present (12/16/2023)   Hunger Vital Sign    Worried About Running Out of Food in the Last Year: Sometimes true    Ran Out of Food in the Last Year: Sometimes true  Transportation Needs: No Transportation Needs (12/16/2023)   PRAPARE - Administrator, Civil Service (Medical): No    Lack of Transportation (Non-Medical): No  Physical Activity: Not on file  Stress: Not on file  Social Connections: Not on file  Intimate Partner Violence: Not At Risk (12/16/2023)   Humiliation, Afraid, Rape, and Kick questionnaire    Fear of Current or Ex-Partner: No    Emotionally Abused: No    Physically Abused: No    Sexually Abused: No    REVIEW OF SYSTEMS: New patient intake form was reviewed.  Complete 10-system review is negative except for the following: Joint pain, glaucoma  PHYSICAL EXAM: BP 132/61 (BP Location: Left Arm, Patient Position: Sitting)   Pulse 83   Temp 97.7 F (36.5 C) (Oral)   Resp 19   Ht 5' 6 (1.676 m)   Wt 152 lb 6.4 oz (69.1 kg)   SpO2 100%   BMI 24.60 kg/m  Constitutional: No acute distress. Neuro/Psych: Alert, oriented.  Head and Neck: Normocephalic, atraumatic. Neck symmetric without masses. Sclera anicteric.  Respiratory: Normal work of breathing. Clear to auscultation bilaterally. Cardiovascular: Regular rate and rhythm, no murmurs, rubs,  or gallops. Abdomen: Normoactive bowel sounds. Soft, non-distended, non-tender to palpation. No masses appreciated. Well healed peirumbilical and pfannenstiel  incisions. Extremities: Grossly normal range of motion. Warm, well perfused. No edema bilaterally. Skin: No rashes or lesions. Lymphatic: No cervical, supraclavicular, or inguinal adenopathy. Genitourinary: External genitalia without lesions. Urethral meatus without lesions or prolapse. On speculum exam, vagina. Normal ectocervix. Endocervical polyp noted. Bimanual exam reveals normal cervix, small mobile uterus, small fullness in left adnexa. Exam chaperoned by Eleanor Epps, NP  EMB Procedure: After appropriate verbal informed consent was obtained, a timeout was performed. A sterile speculum was placed in the vagina, and the area was cleaned with betadine x3. 1ml of 2% lidocaine was infiltrated into the anterior lip of the cervix. A single-tooth tenaculum was placed on the anterior lip of the cervix. A cervical block was placed with 10ml of 2% lidocaine. Using a tischler forceps the endocervical polyp was biopsied. An endometrial biopsy pipelle was advanced carefully to the uterine fundus which sounded to 6.5cm. A scant sample was obtained over 2 passes. The tenaculum was removed, and tenaculum sites were noted to be hemostatic. The speculum was removed from the vagina. The patient tolerated the procedure well.   UPT: not indicated   LABORATORY AND RADIOLOGIC DATA: Outside medical records were reviewed to synthesize the above history, along with the history and physical obtained during the visit.  Outside laboratory, pathology, and imaging reports were reviewed, with pertinent results below.  I personally reviewed the outside images.  WBC  Date Value Ref Range Status  08/20/2013 7.9 4.6 - 10.2 K/uL Final  06/18/2011 6.8 4.0 - 10.5 K/uL Final   Hemoglobin  Date Value Ref Range Status  08/20/2013 14.1 12.2 - 16.2 g/dL Final  94/79/7986 85.6 12.0 - 15.0 g/dL Final   HCT  Date Value Ref Range Status  06/18/2011 42.0 36.0 - 46.0 % Final   HCT, POC  Date Value Ref Range Status  08/20/2013  44.6 37.7 - 47.9 % Final   Platelets  Date Value Ref Range Status  06/18/2011 325 150 - 400 K/uL Final   Creatinine, Ser  Date Value Ref Range Status  06/18/2011 0.72 0.50 - 1.10 mg/dL Final    Pelvic ultrasound (10/09/23): Uterus 6.61 x 3.19 x 5.37 cm Anteverted uterus. Uterine fibroid left anterior calcified 3.5 x 3.3 x 3.2 cm, stable in size from previous ultrasound 06/13/2023. Endometrium slightly thickened. Left ovary within normal limits.  Left adnexa with complex cyst with multiple echogenic particles with then seen adjacent to left ovary 3.9 x 3.3 x 2.1 cm with echogenic component posterior lateral wall measuring 0.8 x 0.7 x 0.7 cm.  All stable in size from previous ultrasound on 06/13/2023 as well as another echogenic area seen posterior wall measuring 1.8 x 1.2 x 0.6 cm,?  Debris,?  Paraovarian versus paratubal, avascular. Right ovary not visualized (patient states had ovary removed).  No adnexal masses seen.  CA125 (06/21/23): 8.4  Surgical pathology (04/04/23): Endometrial biopsy: benign endometrial polyp and atrophic endometrium. No evidence of hyperplasia, endometritis, or atypia.

## 2023-12-16 NOTE — Patient Instructions (Addendum)
 It was a pleasure to see you in clinic today. - Labs today - Endometrial biopsy today - pelvic ultrasound at the hospital ordered - Return visit planned for after completion of workup  Thank you very much for allowing me to provide care for you today.  I appreciate your confidence in choosing our Gynecologic Oncology team at [facility].  If you have any questions about your visit today please call our office or send us  a MyChart message and we will get back to you as soon as possible.

## 2023-12-16 NOTE — Addendum Note (Signed)
 Addended by: ELDONNA MAYS on: 12/16/2023 12:10 PM   Modules accepted: Level of Service

## 2023-12-17 ENCOUNTER — Telehealth: Payer: Self-pay | Admitting: Licensed Clinical Social Worker

## 2023-12-17 ENCOUNTER — Ambulatory Visit: Payer: Self-pay | Admitting: Psychiatry

## 2023-12-17 LAB — CA 125: Cancer Antigen (CA) 125: 9.6 U/mL (ref 0.0–38.1)

## 2023-12-17 LAB — CANCER ANTIGEN 19-9: CA 19-9: 20 U/mL (ref 0–35)

## 2023-12-17 NOTE — Telephone Encounter (Signed)
 [facility] Clinical Social Work  Clinical Social Work was referred by statistician for [employer] needs (food).  Clinical Social Worker attempted to contact patient by phone to offer support and assess for needs.   No answer. Left VM with reason for call & direct contact information.     Ahmani Prehn E Hawa Henly, LCSW  Clinical Social Worker [facility]

## 2023-12-18 ENCOUNTER — Encounter: Payer: Self-pay | Admitting: Family Medicine

## 2023-12-19 ENCOUNTER — Telehealth: Payer: Self-pay | Admitting: Licensed Clinical Social Worker

## 2023-12-19 LAB — INHIBIN B: Inhibin B: 17 pg/mL — ABNORMAL HIGH (ref 0.0–16.9)

## 2023-12-19 NOTE — Telephone Encounter (Signed)
 [facility] Clinical Social Work  Visual Merchandiser attempted to contact patient by phone to offer support and assess for needs (food insecurity on SDOH). Pt answered but stated she was in class and unable to speak. Stated that she would call back later today.     Kimberly Vittorio E Albert Devaul, LCSW  Clinical Social Worker [facility]

## 2023-12-20 LAB — SURGICAL PATHOLOGY

## 2023-12-24 ENCOUNTER — Other Ambulatory Visit

## 2023-12-30 ENCOUNTER — Inpatient Hospital Stay: Attending: Psychiatry | Admitting: Psychiatry

## 2023-12-30 DIAGNOSIS — N9489 Other specified conditions associated with female genital organs and menstrual cycle: Secondary | ICD-10-CM

## 2023-12-30 NOTE — Progress Notes (Unsigned)
This encounter was created in error - please disregard.no show

## 2024-01-02 ENCOUNTER — Ambulatory Visit
Admission: RE | Admit: 2024-01-02 | Discharge: 2024-01-02 | Disposition: A | Discharge status: Home / Self Care | Source: Ambulatory Visit | Attending: Psychiatry | Admitting: Psychiatry

## 2024-01-02 DIAGNOSIS — R9389 Abnormal findings on diagnostic imaging of other specified body structures: Secondary | ICD-10-CM

## 2024-01-02 DIAGNOSIS — N83209 Unspecified ovarian cyst, unspecified side: Secondary | ICD-10-CM | POA: Diagnosis not present

## 2024-01-02 DIAGNOSIS — N9489 Other specified conditions associated with female genital organs and menstrual cycle: Secondary | ICD-10-CM

## 2024-01-14 NOTE — Telephone Encounter (Signed)
 Per Dr.Newton, I reached out to Ms. Kimberly Garza.   LVM for her to call the office regarding recent ultrasound results and also rescheduling a follow up appointment.

## 2024-01-14 NOTE — Telephone Encounter (Signed)
-----   Message from Hoy Masters, MD sent at 01/14/2024  7:40 AM EST ----- Please reschedule pt follow-up.

## 2024-01-14 NOTE — Telephone Encounter (Signed)
 Kimberly Garza returned call. She is aware of the ultrasound results and a follow up appointment has been scheduled for 2/29 @ 4:00.

## 2024-01-14 NOTE — Telephone Encounter (Signed)
 2nd attempt to reach Ms. Kimberly Garza regarding ultrasound results and needing a follow up appointment with Dr.Newton

## 2024-02-05 ENCOUNTER — Ambulatory Visit (INDEPENDENT_AMBULATORY_CARE_PROVIDER_SITE_OTHER)

## 2024-02-05 ENCOUNTER — Ambulatory Visit: Admitting: Podiatry

## 2024-02-05 DIAGNOSIS — M7751 Other enthesopathy of right foot: Secondary | ICD-10-CM | POA: Diagnosis not present

## 2024-02-05 DIAGNOSIS — M2041 Other hammer toe(s) (acquired), right foot: Secondary | ICD-10-CM | POA: Diagnosis not present

## 2024-02-05 MED ORDER — TRIAMCINOLONE ACETONIDE 10 MG/ML IJ SUSP
10.0000 mg | Freq: Once | INTRAMUSCULAR | Status: AC
  Administered 2024-02-05: 10 mg via INTRA_ARTICULAR

## 2024-02-05 NOTE — Progress Notes (Signed)
 Subjective:   Patient ID: Kimberly Garza, female   DOB: 79 y.o.   MRN: [id number]   HPI Patient presents with a painful hammertoe second digit right foot that she has tried to work on herself and states it is very tender and has fluid.  States she has had a long time but over the last month or 2 its gotten worse.  Patient does not smoke likes to be active   Review of Systems  All other systems reviewed and are negative.       Objective:  Physical Exam Vitals and nursing note reviewed.  Constitutional:      Appearance: She is well-developed.  Pulmonary:     Effort: Pulmonary effort is normal.  Musculoskeletal:        General: Normal range of motion.  Skin:    General: Skin is warm.  Neurological:     Mental Status: She is alert.     Neurovascular status intact with patient found to have good digital perfusion well oriented x 3 is noted to have normal muscle strength normal range of motion subtalar midtarsal joint with a rigid contracture digit to right that is painful when pressed with fluid at the inner phalangeal joint that is painful and hard to wear shoe gear with.  Patient has no other health issues well-oriented     Assessment:  Structural deformity second digit right with inflammation fluid of the inner phalangeal joint digit 2 and keratotic lesion that is formed     Plan:  H&P x-ray reviewed and at this point I did do sterile prep I injected the inner phalangeal joint capsule 2 mg dexamethasone Kenalog  5 mg Xylocaine I debrided the lesion I advised on wider shoes and I did discuss hammertoe repair with digital fusion if symptoms persist  X-rays indicate that there is moderate elevation of the second digit right consistent with hammertoe

## 2024-02-07 ENCOUNTER — Emergency Department (HOSPITAL_BASED_OUTPATIENT_CLINIC_OR_DEPARTMENT_OTHER)
Admission: EM | Admit: 2024-02-07 | Discharge: 2024-02-07 | Disposition: A | Discharge status: Home / Self Care | Attending: Emergency Medicine | Admitting: Emergency Medicine

## 2024-02-07 ENCOUNTER — Other Ambulatory Visit: Payer: Self-pay

## 2024-02-07 ENCOUNTER — Emergency Department (HOSPITAL_BASED_OUTPATIENT_CLINIC_OR_DEPARTMENT_OTHER): Admitting: Radiology

## 2024-02-07 DIAGNOSIS — J209 Acute bronchitis, unspecified: Secondary | ICD-10-CM | POA: Diagnosis not present

## 2024-02-07 DIAGNOSIS — R059 Cough, unspecified: Secondary | ICD-10-CM | POA: Diagnosis present

## 2024-02-07 DIAGNOSIS — I10 Essential (primary) hypertension: Secondary | ICD-10-CM | POA: Insufficient documentation

## 2024-02-07 DIAGNOSIS — J45909 Unspecified asthma, uncomplicated: Secondary | ICD-10-CM | POA: Insufficient documentation

## 2024-02-07 LAB — RESP PANEL BY RT-PCR (RSV, FLU A&B, COVID)  RVPGX2
Influenza A by PCR: NEGATIVE
Influenza B by PCR: NEGATIVE
Resp Syncytial Virus by PCR: NEGATIVE
SARS Coronavirus 2 by RT PCR: NEGATIVE

## 2024-02-07 MED ORDER — PREDNISONE 20 MG PO TABS: 40.0000 mg | ORAL_TABLET | Freq: Every day | ORAL | 0 refills | Status: AC

## 2024-02-07 NOTE — Discharge Instructions (Signed)
 Please take the entire course of steroids that are prescribed to help with your congestion, follow-up with your primary care doctor if your symptoms or not improving.  Please return to the emergency department if you have significant worsening shortness of breath, develop chest pain.

## 2024-02-07 NOTE — ED Provider Notes (Signed)
 " Kimberly Garza EMERGENCY DEPARTMENT AT [facility] Provider Note   CSN: [id number] Arrival date & time: 02/07/24  1652     Patient presents with: No chief complaint on file.   Kimberly Garza is a 79 y.o. female with past medical history significant for well-controlled hypertension, asthma, bronchitis who presents concern for persistent cough, congestion for 1 to 2 weeks.  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  She denies any fever, chills, chest pain, feeling of heart racing, she denies feeling short of breath.  She thought she was actually going to urgent care not the emergency department.  She reports that she used her inhaler once a day and has been taking Mucinex with some relief.   HPI     Prior to Admission medications  Medication Sig Start Date End Date Taking? Authorizing Provider  predniSONE  (DELTASONE ) 20 MG tablet Take 2 tablets (40 mg total) by mouth daily. 02/07/24  Yes [place] Urieta H, PA-C  acetaminophen  (TYLENOL ) 500 MG tablet Take 1,000 mg by mouth every 6 (six) hours as needed for mild pain, moderate pain, fever or headache.    [provider]  albuterol  (PROVENTIL  HFA;VENTOLIN  HFA) 108 (90 Base) MCG/ACT inhaler Inhale 2 puffs into the lungs every 4 (four) hours as needed for wheezing or shortness of breath. 02/09/18   Adah Corning A, FNP  Ashwagandha 120 MG CAPS Take by mouth at bedtime.    [provider]  bimatoprost (LUMIGAN) 0.03 % ophthalmic solution Place 1 drop into both eyes at bedtime.    [provider]  brimonidine (ALPHAGAN) 0.15 % ophthalmic solution Place 1 drop into both eyes 3 (three) times daily.     [provider]  brinzolamide (AZOPT) 1 % ophthalmic suspension Place 1 drop into both eyes 2 (two) times daily.    [provider]  Cholecalciferol (VITAMIN D-1000 MAX ST) 1000 units tablet Take 1,000 Units by mouth daily.    [provider]  ELDERBERRY PO Take by mouth.    [provider]  fluticasone  (FLOVENT  HFA) 110 MCG/ACT inhaler ONE PUFF TWICE A DAY TO PREVENT COUGH OR WHEEZE. RINSE, GARGLE AND SPIT AFTER USEL 07/19/15   Asa Aloysius LABOR, MD  meclizine  (ANTIVERT ) 12.5 MG tablet Take 1 tablet (12.5 mg total) by mouth 3 (three) times daily as needed for dizziness. 07/03/18   Christopher Savannah, PA-C  montelukast  (SINGULAIR ) 10 MG tablet Take 1 tablet (10 mg total) by mouth at bedtime. 07/03/18   Christopher Savannah, PA-C  Multiple Vitamin (MULTIVITAMIN) tablet Take 1 tablet by mouth daily. Patient taking differently: Take 1 tablet by mouth daily. Daily 5    [provider]    Allergies: Shrimp [shellfish allergy], Penicillins, and Levaquin [levofloxacin in d5w]    Review of Systems  All other systems reviewed and are negative.   Updated Vital Signs BP (!) 134/93   Pulse 69   Temp 98.4 F (36.9 C) (Oral)   Resp 17   Wt 70.3 kg   SpO2 100%   BMI 25.02 kg/m   Physical Exam Vitals and nursing note reviewed.  Constitutional:      General: She is not in acute distress.    Appearance: Normal appearance.  HENT:     Head: Normocephalic and atraumatic.  Eyes:     General:        Right eye: No discharge.        Left eye: No discharge.  Cardiovascular:     Rate and  Rhythm: Normal rate and regular rhythm.     Heart sounds: No murmur heard.    No friction rub. No gallop.  Pulmonary:     Effort: Pulmonary effort is normal.     Breath sounds: Normal breath sounds.     Comments: No wheezing, rhonchi, stridor, rales, no evidence of focal consolidation on my exam Abdominal:     General: Bowel sounds are normal.     Palpations: Abdomen is soft.  Skin:    General: Skin is warm and dry.     Capillary Refill: Capillary refill takes less than 2 seconds.  Neurological:     Mental Status: She is alert and oriented to person, place, and time.  Psychiatric:        Mood and Affect: Mood normal.        Behavior: Behavior normal.     (all labs ordered are listed,  but only abnormal results are displayed) Labs Reviewed  RESP PANEL BY RT-PCR (RSV, FLU A&B, COVID)  [id number]    EKG: None  Radiology: DG Chest 2 View Result Date: 02/07/2024 EXAM: 2 VIEW(S) XRAY OF THE CHEST 02/07/2024 06:23:14 PM COMPARISON: None available. CLINICAL HISTORY: cough FINDINGS: LUNGS AND PLEURA: No focal pulmonary opacity. No pleural effusion. No pneumothorax. HEART AND MEDIASTINUM: No acute abnormality of the cardiac and mediastinal silhouettes. BONES AND SOFT TISSUES: No acute osseous abnormality. IMPRESSION: 1. No acute cardiopulmonary abnormality. Electronically signed by: Dorethia Molt MD MD 02/07/2024 06:53 PM EST RP Workstation: [id number]     Procedures   Medications Ordered in the ED - No data to display                                  Medical Decision Making Amount and/or Complexity of Data Reviewed Radiology: ordered.  Risk Prescription drug management.   This patient is a 79 y.o. female  who presents to the ED for concern of cough, congestion, without shob.   Differential diagnoses prior to evaluation: The emergent differential diagnosis includes, but is not limited to, acute bronchitis, pneumonia, asthma or bronchitis exacerbation, versus other, also considered URI, Covid, flu, vs other viral illness. This is not an exhaustive differential.   Past Medical History / Co-morbidities / Social History: Hypertension, asthma, bronchitis  Physical Exam: Physical exam performed. The pertinent findings include: Vital signs stable in the emergency department other than some very mild diastolic hypertension, blood sugar 134/93.  Stable oxygen saturation on room air, no respiratory distress, normal respiratory rate, afebrile.  Nontachycardic.  Overall clear lung sounds throughout without wheezing, rhonchi, stridor, rales or focal consolidation.  Lab Tests/Imaging studies: I personally interpreted labs/imaging and the pertinent results include: COVID, flu, RSV are  unremarkable, I independently interpreted plain film chest x-ray which shows no evidence of acute intrathoracic abnormality. I agree with the radiologist interpretation.   Medications: Recommended short burst of prednisone  for bronchitis, no acute management recommended in the ED   Disposition: After consideration of the diagnostic results and the patients response to treatment, I feel that patient is stable for discharge with plan as above .   emergency department workup does not suggest an emergent condition requiring admission or immediate intervention beyond what has been performed at this time. The plan is: as above. The patient is safe for discharge and has been instructed to return immediately for worsening symptoms, change in symptoms or any other concerns.   Final diagnoses:  Acute bronchitis,  unspecified organism    ED Discharge Orders          Ordered    predniSONE  (DELTASONE ) 20 MG tablet  Daily        02/07/24 1904               Rosan Sherlean DEL, [place] 02/07/24 1906  "

## 2024-02-07 NOTE — ED Notes (Signed)
 Reviewed discharge instructions, medications, and home care with pt. Pt verbalized understanding and had no further questions. Pt exited ED without complications.

## 2024-02-07 NOTE — ED Triage Notes (Signed)
 Cough and congestion x1 week. Using guaifenesin . Works at school. Some tenderness in ribs with coughing. Denies SOB, CP. -N/-V/-D.

## 2024-02-26 ENCOUNTER — Ambulatory Visit (HOSPITAL_BASED_OUTPATIENT_CLINIC_OR_DEPARTMENT_OTHER)

## 2024-03-23 ENCOUNTER — Inpatient Hospital Stay: Admitting: Psychiatry

## 2024-04-30 ENCOUNTER — Other Ambulatory Visit (HOSPITAL_BASED_OUTPATIENT_CLINIC_OR_DEPARTMENT_OTHER)
